# Patient Record
Sex: Female | Born: 1945 | Race: White | Hispanic: No | Marital: Married | Smoking: Former smoker
Health system: Southern US, Community
[De-identification: ages and names within clinical notes are randomized; demographics above are authoritative.]

## PROBLEM LIST (undated history)

## (undated) DIAGNOSIS — E559 Vitamin D deficiency, unspecified: Secondary | ICD-10-CM

## (undated) DIAGNOSIS — E2839 Other primary ovarian failure: Secondary | ICD-10-CM

## (undated) DIAGNOSIS — I8393 Asymptomatic varicose veins of bilateral lower extremities: Secondary | ICD-10-CM

## (undated) DIAGNOSIS — J309 Allergic rhinitis, unspecified: Secondary | ICD-10-CM

## (undated) DIAGNOSIS — M67432 Ganglion, left wrist: Secondary | ICD-10-CM

## (undated) DIAGNOSIS — E669 Obesity, unspecified: Secondary | ICD-10-CM

## (undated) DIAGNOSIS — N390 Urinary tract infection, site not specified: Secondary | ICD-10-CM

## (undated) DIAGNOSIS — R42 Dizziness and giddiness: Secondary | ICD-10-CM

## (undated) DIAGNOSIS — M7989 Other specified soft tissue disorders: Secondary | ICD-10-CM

## (undated) DIAGNOSIS — K5909 Other constipation: Secondary | ICD-10-CM

## (undated) DIAGNOSIS — E781 Pure hyperglyceridemia: Secondary | ICD-10-CM

## (undated) DIAGNOSIS — Z780 Asymptomatic menopausal state: Secondary | ICD-10-CM

## (undated) DIAGNOSIS — E782 Mixed hyperlipidemia: Secondary | ICD-10-CM

## (undated) DIAGNOSIS — K219 Gastro-esophageal reflux disease without esophagitis: Secondary | ICD-10-CM

## (undated) DIAGNOSIS — R600 Localized edema: Secondary | ICD-10-CM

## (undated) DIAGNOSIS — E785 Hyperlipidemia, unspecified: Secondary | ICD-10-CM

## (undated) DIAGNOSIS — I10 Essential (primary) hypertension: Secondary | ICD-10-CM

## (undated) DIAGNOSIS — K279 Peptic ulcer, site unspecified, unspecified as acute or chronic, without hemorrhage or perforation: Secondary | ICD-10-CM

## (undated) DIAGNOSIS — M1712 Unilateral primary osteoarthritis, left knee: Secondary | ICD-10-CM

## (undated) DIAGNOSIS — H68009 Unspecified Eustachian salpingitis, unspecified ear: Secondary | ICD-10-CM

## (undated) DIAGNOSIS — R200 Anesthesia of skin: Secondary | ICD-10-CM

## (undated) DIAGNOSIS — G4700 Insomnia, unspecified: Secondary | ICD-10-CM

## (undated) DIAGNOSIS — R739 Hyperglycemia, unspecified: Secondary | ICD-10-CM

## (undated) DIAGNOSIS — N3281 Overactive bladder: Secondary | ICD-10-CM

## (undated) DIAGNOSIS — R7303 Prediabetes: Secondary | ICD-10-CM

## (undated) DIAGNOSIS — L03039 Cellulitis of unspecified toe: Secondary | ICD-10-CM

## (undated) DIAGNOSIS — R1310 Dysphagia, unspecified: Secondary | ICD-10-CM

## (undated) DIAGNOSIS — I83893 Varicose veins of bilateral lower extremities with other complications: Secondary | ICD-10-CM

## (undated) DIAGNOSIS — M79671 Pain in right foot: Secondary | ICD-10-CM

## (undated) DIAGNOSIS — M810 Age-related osteoporosis without current pathological fracture: Secondary | ICD-10-CM

## (undated) DIAGNOSIS — J209 Acute bronchitis, unspecified: Secondary | ICD-10-CM

## (undated) DIAGNOSIS — N760 Acute vaginitis: Secondary | ICD-10-CM

## (undated) HISTORY — DX: Prediabetes: R73.03

## (undated) HISTORY — DX: Mixed hyperlipidemia: E78.2

## (undated) HISTORY — DX: Peptic ulcer, site unspecified, unspecified as acute or chronic, without hemorrhage or perforation: K27.9

## (undated) HISTORY — DX: Acute bronchitis, unspecified: J20.9

## (undated) HISTORY — DX: Hyperglycemia, unspecified: R73.9

## (undated) HISTORY — DX: Overactive bladder: N32.81

## (undated) HISTORY — DX: Pure hyperglyceridemia: E78.1

## (undated) HISTORY — DX: Urinary tract infection, site not specified: N39.0

## (undated) HISTORY — DX: Essential (primary) hypertension: I10

## (undated) HISTORY — DX: Other primary ovarian failure: E28.39

## (undated) HISTORY — DX: Unilateral primary osteoarthritis, left knee: M17.12

## (undated) HISTORY — DX: Allergic rhinitis, unspecified: J30.9

## (undated) HISTORY — DX: Varicose veins of bilateral lower extremities with other complications: I83.893

## (undated) HISTORY — DX: Age-related osteoporosis without current pathological fracture: M81.0

## (undated) HISTORY — DX: Vitamin D deficiency, unspecified: E55.9

## (undated) HISTORY — DX: Gastro-esophageal reflux disease without esophagitis: K21.9

## (undated) HISTORY — DX: Anesthesia of skin: R20.0

## (undated) HISTORY — DX: Insomnia, unspecified: G47.00

## (undated) HISTORY — DX: Other constipation: K59.09

## (undated) HISTORY — DX: Other specified soft tissue disorders: M79.89

## (undated) HISTORY — DX: Asymptomatic menopausal state: Z78.0

## (undated) HISTORY — DX: Unspecified eustachian salpingitis, unspecified ear: H68.009

## (undated) HISTORY — DX: Cellulitis of unspecified toe: L03.039

## (undated) HISTORY — DX: Asymptomatic varicose veins of bilateral lower extremities: I83.93

## (undated) HISTORY — DX: Hyperlipidemia, unspecified: E78.5

## (undated) HISTORY — DX: Obesity, unspecified: E66.9

## (undated) HISTORY — DX: Ganglion, left wrist: M67.432

## (undated) HISTORY — DX: Pain in right foot: M79.671

## (undated) HISTORY — DX: Localized edema: R60.0

## (undated) HISTORY — DX: Acute vaginitis: N76.0

## (undated) HISTORY — DX: Dysphagia, unspecified: R13.10

## (undated) HISTORY — DX: Dizziness and giddiness: R42

---

## 1976-02-19 HISTORY — PX: PARTIAL HYSTERECTOMY: SHX80

## 2011-03-12 DIAGNOSIS — Z79899 Other long term (current) drug therapy: Secondary | ICD-10-CM | POA: Diagnosis not present

## 2011-03-12 DIAGNOSIS — N390 Urinary tract infection, site not specified: Secondary | ICD-10-CM | POA: Diagnosis not present

## 2011-03-12 DIAGNOSIS — E782 Mixed hyperlipidemia: Secondary | ICD-10-CM | POA: Diagnosis not present

## 2011-03-12 DIAGNOSIS — R609 Edema, unspecified: Secondary | ICD-10-CM | POA: Diagnosis not present

## 2011-03-12 DIAGNOSIS — I10 Essential (primary) hypertension: Secondary | ICD-10-CM | POA: Diagnosis not present

## 2011-03-26 DIAGNOSIS — Z6830 Body mass index (BMI) 30.0-30.9, adult: Secondary | ICD-10-CM | POA: Diagnosis not present

## 2011-03-26 DIAGNOSIS — M1710 Unilateral primary osteoarthritis, unspecified knee: Secondary | ICD-10-CM | POA: Diagnosis not present

## 2011-03-26 DIAGNOSIS — I10 Essential (primary) hypertension: Secondary | ICD-10-CM | POA: Diagnosis not present

## 2011-03-26 DIAGNOSIS — IMO0002 Reserved for concepts with insufficient information to code with codable children: Secondary | ICD-10-CM | POA: Diagnosis not present

## 2011-03-26 DIAGNOSIS — N760 Acute vaginitis: Secondary | ICD-10-CM | POA: Diagnosis not present

## 2011-04-23 DIAGNOSIS — H68009 Unspecified Eustachian salpingitis, unspecified ear: Secondary | ICD-10-CM | POA: Diagnosis not present

## 2011-04-23 DIAGNOSIS — I10 Essential (primary) hypertension: Secondary | ICD-10-CM | POA: Diagnosis not present

## 2011-04-23 DIAGNOSIS — J45909 Unspecified asthma, uncomplicated: Secondary | ICD-10-CM | POA: Diagnosis not present

## 2011-04-23 DIAGNOSIS — J309 Allergic rhinitis, unspecified: Secondary | ICD-10-CM | POA: Diagnosis not present

## 2011-05-29 DIAGNOSIS — R609 Edema, unspecified: Secondary | ICD-10-CM | POA: Diagnosis not present

## 2011-05-29 DIAGNOSIS — H68009 Unspecified Eustachian salpingitis, unspecified ear: Secondary | ICD-10-CM | POA: Diagnosis not present

## 2011-05-29 DIAGNOSIS — J309 Allergic rhinitis, unspecified: Secondary | ICD-10-CM | POA: Diagnosis not present

## 2011-05-29 DIAGNOSIS — I10 Essential (primary) hypertension: Secondary | ICD-10-CM | POA: Diagnosis not present

## 2011-07-01 DIAGNOSIS — I10 Essential (primary) hypertension: Secondary | ICD-10-CM | POA: Diagnosis not present

## 2011-07-01 DIAGNOSIS — J309 Allergic rhinitis, unspecified: Secondary | ICD-10-CM | POA: Diagnosis not present

## 2011-07-01 DIAGNOSIS — R609 Edema, unspecified: Secondary | ICD-10-CM | POA: Diagnosis not present

## 2011-07-01 DIAGNOSIS — Z6830 Body mass index (BMI) 30.0-30.9, adult: Secondary | ICD-10-CM | POA: Diagnosis not present

## 2011-07-01 DIAGNOSIS — Z79899 Other long term (current) drug therapy: Secondary | ICD-10-CM | POA: Diagnosis not present

## 2011-10-01 DIAGNOSIS — J309 Allergic rhinitis, unspecified: Secondary | ICD-10-CM | POA: Diagnosis not present

## 2011-10-01 DIAGNOSIS — Z79899 Other long term (current) drug therapy: Secondary | ICD-10-CM | POA: Diagnosis not present

## 2011-10-01 DIAGNOSIS — E782 Mixed hyperlipidemia: Secondary | ICD-10-CM | POA: Diagnosis not present

## 2011-10-01 DIAGNOSIS — E559 Vitamin D deficiency, unspecified: Secondary | ICD-10-CM | POA: Diagnosis not present

## 2011-10-01 DIAGNOSIS — R609 Edema, unspecified: Secondary | ICD-10-CM | POA: Diagnosis not present

## 2011-12-31 DIAGNOSIS — E785 Hyperlipidemia, unspecified: Secondary | ICD-10-CM | POA: Diagnosis not present

## 2011-12-31 DIAGNOSIS — M1710 Unilateral primary osteoarthritis, unspecified knee: Secondary | ICD-10-CM | POA: Diagnosis not present

## 2011-12-31 DIAGNOSIS — Z1212 Encounter for screening for malignant neoplasm of rectum: Secondary | ICD-10-CM | POA: Diagnosis not present

## 2011-12-31 DIAGNOSIS — I10 Essential (primary) hypertension: Secondary | ICD-10-CM | POA: Diagnosis not present

## 2011-12-31 DIAGNOSIS — Z0000 Encounter for general adult medical examination without abnormal findings: Secondary | ICD-10-CM | POA: Diagnosis not present

## 2011-12-31 DIAGNOSIS — Z6830 Body mass index (BMI) 30.0-30.9, adult: Secondary | ICD-10-CM | POA: Diagnosis not present

## 2011-12-31 DIAGNOSIS — Z79899 Other long term (current) drug therapy: Secondary | ICD-10-CM | POA: Diagnosis not present

## 2011-12-31 DIAGNOSIS — Z23 Encounter for immunization: Secondary | ICD-10-CM | POA: Diagnosis not present

## 2012-01-08 DIAGNOSIS — Z13820 Encounter for screening for osteoporosis: Secondary | ICD-10-CM | POA: Diagnosis not present

## 2012-01-08 DIAGNOSIS — Z1231 Encounter for screening mammogram for malignant neoplasm of breast: Secondary | ICD-10-CM | POA: Diagnosis not present

## 2012-04-07 DIAGNOSIS — M1710 Unilateral primary osteoarthritis, unspecified knee: Secondary | ICD-10-CM | POA: Diagnosis not present

## 2012-04-07 DIAGNOSIS — I10 Essential (primary) hypertension: Secondary | ICD-10-CM | POA: Diagnosis not present

## 2012-04-07 DIAGNOSIS — E782 Mixed hyperlipidemia: Secondary | ICD-10-CM | POA: Diagnosis not present

## 2012-04-07 DIAGNOSIS — Z6831 Body mass index (BMI) 31.0-31.9, adult: Secondary | ICD-10-CM | POA: Diagnosis not present

## 2012-05-27 DIAGNOSIS — H2510 Age-related nuclear cataract, unspecified eye: Secondary | ICD-10-CM | POA: Diagnosis not present

## 2012-07-15 DIAGNOSIS — I10 Essential (primary) hypertension: Secondary | ICD-10-CM | POA: Diagnosis not present

## 2012-07-15 DIAGNOSIS — E782 Mixed hyperlipidemia: Secondary | ICD-10-CM | POA: Diagnosis not present

## 2012-07-15 DIAGNOSIS — E559 Vitamin D deficiency, unspecified: Secondary | ICD-10-CM | POA: Diagnosis not present

## 2012-07-15 DIAGNOSIS — M1710 Unilateral primary osteoarthritis, unspecified knee: Secondary | ICD-10-CM | POA: Diagnosis not present

## 2012-07-15 DIAGNOSIS — Z6830 Body mass index (BMI) 30.0-30.9, adult: Secondary | ICD-10-CM | POA: Diagnosis not present

## 2012-07-15 DIAGNOSIS — Z79899 Other long term (current) drug therapy: Secondary | ICD-10-CM | POA: Diagnosis not present

## 2012-07-16 DIAGNOSIS — M25569 Pain in unspecified knee: Secondary | ICD-10-CM | POA: Diagnosis not present

## 2012-07-16 DIAGNOSIS — IMO0002 Reserved for concepts with insufficient information to code with codable children: Secondary | ICD-10-CM | POA: Diagnosis not present

## 2012-07-16 DIAGNOSIS — M1710 Unilateral primary osteoarthritis, unspecified knee: Secondary | ICD-10-CM | POA: Diagnosis not present

## 2012-07-16 DIAGNOSIS — M7989 Other specified soft tissue disorders: Secondary | ICD-10-CM | POA: Diagnosis not present

## 2012-10-15 DIAGNOSIS — N390 Urinary tract infection, site not specified: Secondary | ICD-10-CM | POA: Diagnosis not present

## 2012-10-15 DIAGNOSIS — R300 Dysuria: Secondary | ICD-10-CM | POA: Diagnosis not present

## 2012-10-15 DIAGNOSIS — I10 Essential (primary) hypertension: Secondary | ICD-10-CM | POA: Diagnosis not present

## 2012-11-02 DIAGNOSIS — I10 Essential (primary) hypertension: Secondary | ICD-10-CM | POA: Diagnosis not present

## 2012-11-02 DIAGNOSIS — E782 Mixed hyperlipidemia: Secondary | ICD-10-CM | POA: Diagnosis not present

## 2012-11-02 DIAGNOSIS — M1990 Unspecified osteoarthritis, unspecified site: Secondary | ICD-10-CM | POA: Diagnosis not present

## 2012-11-02 DIAGNOSIS — E783 Hyperchylomicronemia: Secondary | ICD-10-CM | POA: Diagnosis not present

## 2012-11-02 DIAGNOSIS — E781 Pure hyperglyceridemia: Secondary | ICD-10-CM | POA: Diagnosis not present

## 2012-11-02 DIAGNOSIS — R609 Edema, unspecified: Secondary | ICD-10-CM | POA: Diagnosis not present

## 2012-11-02 DIAGNOSIS — R7309 Other abnormal glucose: Secondary | ICD-10-CM | POA: Diagnosis not present

## 2012-12-02 DIAGNOSIS — I10 Essential (primary) hypertension: Secondary | ICD-10-CM | POA: Diagnosis not present

## 2012-12-02 DIAGNOSIS — E119 Type 2 diabetes mellitus without complications: Secondary | ICD-10-CM | POA: Diagnosis not present

## 2012-12-02 DIAGNOSIS — E785 Hyperlipidemia, unspecified: Secondary | ICD-10-CM | POA: Diagnosis not present

## 2012-12-02 DIAGNOSIS — Z23 Encounter for immunization: Secondary | ICD-10-CM | POA: Diagnosis not present

## 2013-03-09 DIAGNOSIS — I10 Essential (primary) hypertension: Secondary | ICD-10-CM | POA: Diagnosis not present

## 2013-03-09 DIAGNOSIS — Z0000 Encounter for general adult medical examination without abnormal findings: Secondary | ICD-10-CM | POA: Diagnosis not present

## 2013-03-09 DIAGNOSIS — Z1212 Encounter for screening for malignant neoplasm of rectum: Secondary | ICD-10-CM | POA: Diagnosis not present

## 2013-03-09 DIAGNOSIS — E119 Type 2 diabetes mellitus without complications: Secondary | ICD-10-CM | POA: Diagnosis not present

## 2013-03-23 DIAGNOSIS — R42 Dizziness and giddiness: Secondary | ICD-10-CM | POA: Diagnosis not present

## 2013-03-23 DIAGNOSIS — J309 Allergic rhinitis, unspecified: Secondary | ICD-10-CM | POA: Diagnosis not present

## 2013-03-23 DIAGNOSIS — I10 Essential (primary) hypertension: Secondary | ICD-10-CM | POA: Diagnosis not present

## 2013-04-21 DIAGNOSIS — Z79899 Other long term (current) drug therapy: Secondary | ICD-10-CM | POA: Diagnosis not present

## 2013-04-21 DIAGNOSIS — E119 Type 2 diabetes mellitus without complications: Secondary | ICD-10-CM | POA: Diagnosis not present

## 2013-04-21 DIAGNOSIS — J309 Allergic rhinitis, unspecified: Secondary | ICD-10-CM | POA: Diagnosis not present

## 2013-04-21 DIAGNOSIS — H68009 Unspecified Eustachian salpingitis, unspecified ear: Secondary | ICD-10-CM | POA: Diagnosis not present

## 2013-04-21 DIAGNOSIS — E785 Hyperlipidemia, unspecified: Secondary | ICD-10-CM | POA: Diagnosis not present

## 2013-04-21 DIAGNOSIS — E782 Mixed hyperlipidemia: Secondary | ICD-10-CM | POA: Diagnosis not present

## 2013-04-21 DIAGNOSIS — I10 Essential (primary) hypertension: Secondary | ICD-10-CM | POA: Diagnosis not present

## 2013-06-15 DIAGNOSIS — I10 Essential (primary) hypertension: Secondary | ICD-10-CM | POA: Diagnosis not present

## 2013-06-15 DIAGNOSIS — M1710 Unilateral primary osteoarthritis, unspecified knee: Secondary | ICD-10-CM | POA: Diagnosis not present

## 2013-06-15 DIAGNOSIS — IMO0002 Reserved for concepts with insufficient information to code with codable children: Secondary | ICD-10-CM | POA: Diagnosis not present

## 2013-08-11 DIAGNOSIS — K279 Peptic ulcer, site unspecified, unspecified as acute or chronic, without hemorrhage or perforation: Secondary | ICD-10-CM | POA: Diagnosis not present

## 2013-08-11 DIAGNOSIS — E119 Type 2 diabetes mellitus without complications: Secondary | ICD-10-CM | POA: Diagnosis not present

## 2013-08-11 DIAGNOSIS — I10 Essential (primary) hypertension: Secondary | ICD-10-CM | POA: Diagnosis not present

## 2013-08-11 DIAGNOSIS — E782 Mixed hyperlipidemia: Secondary | ICD-10-CM | POA: Diagnosis not present

## 2013-08-11 DIAGNOSIS — J309 Allergic rhinitis, unspecified: Secondary | ICD-10-CM | POA: Diagnosis not present

## 2013-11-03 DIAGNOSIS — I10 Essential (primary) hypertension: Secondary | ICD-10-CM | POA: Diagnosis not present

## 2013-11-03 DIAGNOSIS — M159 Polyosteoarthritis, unspecified: Secondary | ICD-10-CM | POA: Diagnosis not present

## 2013-11-03 DIAGNOSIS — E119 Type 2 diabetes mellitus without complications: Secondary | ICD-10-CM | POA: Diagnosis not present

## 2013-11-03 DIAGNOSIS — M25569 Pain in unspecified knee: Secondary | ICD-10-CM | POA: Diagnosis not present

## 2013-11-22 DIAGNOSIS — E119 Type 2 diabetes mellitus without complications: Secondary | ICD-10-CM | POA: Diagnosis not present

## 2013-11-22 DIAGNOSIS — K219 Gastro-esophageal reflux disease without esophagitis: Secondary | ICD-10-CM | POA: Diagnosis not present

## 2013-11-22 DIAGNOSIS — I10 Essential (primary) hypertension: Secondary | ICD-10-CM | POA: Diagnosis not present

## 2013-11-22 DIAGNOSIS — Z1389 Encounter for screening for other disorder: Secondary | ICD-10-CM | POA: Diagnosis not present

## 2013-11-22 DIAGNOSIS — Z9181 History of falling: Secondary | ICD-10-CM | POA: Diagnosis not present

## 2013-11-22 DIAGNOSIS — E782 Mixed hyperlipidemia: Secondary | ICD-10-CM | POA: Diagnosis not present

## 2014-03-02 DIAGNOSIS — Z6831 Body mass index (BMI) 31.0-31.9, adult: Secondary | ICD-10-CM | POA: Diagnosis not present

## 2014-03-02 DIAGNOSIS — E559 Vitamin D deficiency, unspecified: Secondary | ICD-10-CM | POA: Diagnosis not present

## 2014-03-02 DIAGNOSIS — Z1231 Encounter for screening mammogram for malignant neoplasm of breast: Secondary | ICD-10-CM | POA: Diagnosis not present

## 2014-03-02 DIAGNOSIS — I10 Essential (primary) hypertension: Secondary | ICD-10-CM | POA: Diagnosis not present

## 2014-03-02 DIAGNOSIS — Z79899 Other long term (current) drug therapy: Secondary | ICD-10-CM | POA: Diagnosis not present

## 2014-03-02 DIAGNOSIS — M179 Osteoarthritis of knee, unspecified: Secondary | ICD-10-CM | POA: Diagnosis not present

## 2014-06-11 HISTORY — PX: REPLACEMENT TOTAL KNEE: SUR1224

## 2015-02-19 HISTORY — PX: CATARACT EXTRACTION: SUR2

## 2018-01-20 ENCOUNTER — Inpatient Hospital Stay (HOSPITAL_COMMUNITY)
Admission: AD | Admit: 2018-01-20 | Discharge: 2018-01-21 | DRG: 185 | Disposition: A | Discharge status: Home / Self Care | Payer: Medicare Other | Source: Other Acute Inpatient Hospital | Attending: General Surgery | Admitting: General Surgery

## 2018-01-20 DIAGNOSIS — I10 Essential (primary) hypertension: Secondary | ICD-10-CM | POA: Diagnosis not present

## 2018-01-20 DIAGNOSIS — S2239XA Fracture of one rib, unspecified side, initial encounter for closed fracture: Secondary | ICD-10-CM

## 2018-01-20 DIAGNOSIS — E785 Hyperlipidemia, unspecified: Secondary | ICD-10-CM | POA: Diagnosis not present

## 2018-01-20 DIAGNOSIS — M79644 Pain in right finger(s): Secondary | ICD-10-CM | POA: Diagnosis present

## 2018-01-20 DIAGNOSIS — S62303A Unspecified fracture of third metacarpal bone, left hand, initial encounter for closed fracture: Secondary | ICD-10-CM | POA: Diagnosis present

## 2018-01-20 DIAGNOSIS — S2242XA Multiple fractures of ribs, left side, initial encounter for closed fracture: Principal | ICD-10-CM | POA: Diagnosis present

## 2018-01-20 DIAGNOSIS — M25539 Pain in unspecified wrist: Secondary | ICD-10-CM

## 2018-01-20 MED ORDER — ACETAMINOPHEN 325 MG PO TABS
650.0000 mg | ORAL_TABLET | ORAL | Status: DC | PRN
  Administered 2018-01-21: 650 mg via ORAL
  Filled 2018-01-20: qty 2

## 2018-01-20 MED ORDER — MORPHINE SULFATE (PF) 2 MG/ML IV SOLN
2.0000 mg | INTRAVENOUS | Status: DC | PRN
  Administered 2018-01-21: 2 mg via INTRAVENOUS
  Filled 2018-01-20: qty 1

## 2018-01-20 MED ORDER — ENOXAPARIN SODIUM 40 MG/0.4ML ~~LOC~~ SOLN: 40.0000 mg | SUBCUTANEOUS | Status: DC

## 2018-01-20 MED ORDER — ONDANSETRON 4 MG PO TBDP: 4.0000 mg | ORAL_TABLET | Freq: Four times a day (QID) | ORAL | Status: DC | PRN

## 2018-01-20 MED ORDER — DOCUSATE SODIUM 100 MG PO CAPS
100.0000 mg | ORAL_CAPSULE | Freq: Two times a day (BID) | ORAL | Status: DC
  Administered 2018-01-21: 100 mg via ORAL
  Filled 2018-01-20 (×2): qty 1

## 2018-01-20 MED ORDER — SODIUM CHLORIDE 0.9 % IV SOLN
INTRAVENOUS | Status: DC
  Administered 2018-01-21: 01:00:00 via INTRAVENOUS

## 2018-01-20 MED ORDER — OXYCODONE HCL 5 MG PO TABS
5.0000 mg | ORAL_TABLET | ORAL | Status: DC | PRN
  Administered 2018-01-21 (×2): 5 mg via ORAL
  Filled 2018-01-20 (×2): qty 1

## 2018-01-20 MED ORDER — HYDRALAZINE HCL 20 MG/ML IJ SOLN: 10.0000 mg | INTRAMUSCULAR | Status: DC | PRN

## 2018-01-20 MED ORDER — BISACODYL 10 MG RE SUPP: 10.0000 mg | Freq: Every day | RECTAL | Status: DC | PRN

## 2018-01-20 MED ORDER — ONDANSETRON HCL 4 MG/2ML IJ SOLN: 4.0000 mg | Freq: Four times a day (QID) | INTRAMUSCULAR | Status: DC | PRN
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
## 2018-01-20 NOTE — Progress Notes (Signed)
Pt arrived to floor transfer from [facility]. Pt alert and oriented x4. Oriented to room and call bell. Notified admitting MD that pt is here.

## 2018-01-21 ENCOUNTER — Inpatient Hospital Stay (HOSPITAL_COMMUNITY): Payer: Medicare Other

## 2018-01-21 DIAGNOSIS — I10 Essential (primary) hypertension: Secondary | ICD-10-CM | POA: Diagnosis not present

## 2018-01-21 DIAGNOSIS — M79644 Pain in right finger(s): Secondary | ICD-10-CM | POA: Diagnosis not present

## 2018-01-21 DIAGNOSIS — S2242XA Multiple fractures of ribs, left side, initial encounter for closed fracture: Secondary | ICD-10-CM | POA: Diagnosis present

## 2018-01-21 DIAGNOSIS — S62303A Unspecified fracture of third metacarpal bone, left hand, initial encounter for closed fracture: Secondary | ICD-10-CM | POA: Diagnosis not present

## 2018-01-21 DIAGNOSIS — E785 Hyperlipidemia, unspecified: Secondary | ICD-10-CM | POA: Diagnosis not present

## 2018-01-21 LAB — CBC
HCT: 36.6 % (ref 36.0–46.0)
Hemoglobin: 11.6 g/dL — ABNORMAL LOW (ref 12.0–15.0)
MCH: 29.9 pg (ref 26.0–34.0)
MCHC: 31.7 g/dL (ref 30.0–36.0)
MCV: 94.3 fL (ref 80.0–100.0)
Platelets: 198 10*3/uL (ref 150–400)
RBC: 3.88 MIL/uL (ref 3.87–5.11)
RDW: 13.4 % (ref 11.5–15.5)
WBC: 7.5 10*3/uL (ref 4.0–10.5)
nRBC: 0 % (ref 0.0–0.2)

## 2018-01-21 LAB — BASIC METABOLIC PANEL
Anion gap: 12 (ref 5–15)
BUN: 21 mg/dL (ref 8–23)
CO2: 25 mmol/L (ref 22–32)
Calcium: 8.9 mg/dL (ref 8.9–10.3)
Chloride: 102 mmol/L (ref 98–111)
Creatinine, Ser: 0.94 mg/dL (ref 0.44–1.00)
GFR calc Af Amer: >60 mL/min (ref >60)
GFR, EST NON AFRICAN AMERICAN: 60 mL/min — AB (ref >60)
Glucose, Bld: 86 mg/dL (ref 70–99)
Potassium: 3.6 mmol/L (ref 3.5–5.1)
SODIUM: 139 mmol/L (ref 135–145)

## 2018-01-21 MED ORDER — OXYCODONE HCL 5 MG PO TABS: 5.0000 mg | ORAL_TABLET | Freq: Four times a day (QID) | ORAL | 0 refills | Status: DC | PRN

## 2018-01-21 MED ORDER — METHOCARBAMOL 500 MG PO TABS
500.0000 mg | ORAL_TABLET | Freq: Three times a day (TID) | ORAL | Status: DC | PRN
  Administered 2018-01-21: 500 mg via ORAL
  Filled 2018-01-21: qty 1

## 2018-01-21 MED ORDER — DOCUSATE SODIUM 100 MG PO CAPS: 100.0000 mg | ORAL_CAPSULE | Freq: Two times a day (BID) | ORAL | 0 refills | Status: DC
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
## 2018-01-21 NOTE — Discharge Instructions (Addendum)
Rib Fracture A rib fracture is a break or crack in one of the bones of the ribs. The ribs are like a cage that goes around your upper chest. A broken or cracked rib is often painful, but most do not cause other problems. Most rib fractures heal on their own in 1-3 months. Follow these instructions at home:  Avoid activities that cause pain to the injured area. Protect your injured area.  Slowly increase activity as told by your doctor.  Take medicine as told by your doctor.  Put ice on the injured area for the first 1-2 days after you have been treated or as told by your doctor. ? Put ice in a plastic bag. ? Place a towel between your skin and the bag. ? Leave the ice on for 15-20 minutes at a time, every 2 hours while you are awake.  Do deep breathing as told by your doctor. You may be told to: ? Take deep breaths many times a day. ? Cough many times a day while hugging a pillow. ? Use a device (incentive spirometer) to perform deep breathing many times a day.  Drink enough fluids to keep your pee (urine) clear or pale yellow.  Do not wear a rib belt or binder. These do not allow you to breathe deeply. Get help right away if:  You have a fever.  You have trouble breathing.  You cannot stop coughing.  You cough up thick or bloody spit (mucus).  You feel sick to your stomach (nauseous), throw up (vomit), or have belly (abdominal) pain.  Your pain gets worse and medicine does not help. This information is not intended to replace advice given to you by your health care provider. Make sure you discuss any questions you have with your health care provider. Document Released: 11/14/2007 Document Revised: 07/13/2015 Document Reviewed: 04/08/2012 Elsevier Interactive Patient Education  2018 [employer]vier Inc[employer]    Cast or Splint Care, Adult Casts and splints are supports that are worn to protect broken bones and other injuries. A cast or splint may hold a bone still and in the correct  position while it heals. Casts and splints may also help to ease pain, swelling, and muscle spasms. How to care for your cast  Do not stick anything inside the cast to scratch your skin.  Check the skin around the cast every day. Tell your doctor about any concerns.  You may put lotion on dry skin around the edges of the cast. Do not put lotion on the skin under the cast.  Keep the cast clean.  If the cast is not waterproof: ? Do not let it get wet. ? Cover it with a watertight covering when you take a bath or a shower. How to care for your splint  Wear it as told by your doctor. Take it off only as told by your doctor.  Loosen the splint if your fingers or toes tingle, get numb, or turn cold and blue.  Keep the splint clean.  If the splint is not waterproof: ? Do not let it get wet. ? Cover it with a watertight covering when you take a bath or a shower. Follow these instructions at home: Bathing  Do not take baths or swim until your doctor says it is okay. Ask your doctor if you can take showers. You may only be allowed to take sponge baths for bathing.  If your cast or splint is not waterproof, cover it with a watertight covering when  you take a bath or shower. Managing pain, stiffness, and swelling  Move your fingers or toes often to avoid stiffness and to lessen swelling.  Raise (elevate) the injured area above the level of your heart while sitting or lying down. Safety  Do not use the injured limb to support your body weight until your doctor says that it is okay.  Use crutches or other assistive devices as told by your doctor. General instructions  Do not put pressure on any part of the cast or splint until it is fully hardened. This may take many hours.  Return to your normal activities as told by your doctor. Ask your doctor what activities are safe for you.  Keep all follow-up visits as told by your doctor. This is important. Contact a doctor if:  Your cast  or splint gets damaged.  The skin around the cast gets red or raw.  The skin under the cast is very itchy or painful.  Your cast or splint feels very uncomfortable.  Your cast or splint is too tight or too loose.  Your cast becomes wet or it starts to have a soft spot or area.  You get an object stuck under your cast. Get help right away if:  Your pain gets worse.  The injured area tingles, gets numb, or turns blue and cold.  The part of your body above or below the cast is swollen and it turns a different color (is discolored).  You cannot feel or move your fingers or toes.  There is fluid leaking through the cast.  You have very bad pain or pressure under the cast.  You have trouble breathing.  You have shortness of breath.  You have chest pain. This information is not intended to replace advice given to you by your health care provider. Make sure you discuss any questions you have with your health care provider. Document Released: 06/06/2010 Document Revised: 01/26/2016 Document Reviewed: 01/26/2016 Elsevier Interactive Patient Education  2017 [employer]
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
## 2018-01-21 NOTE — Progress Notes (Signed)
CSW met with patient, with husband at bedside. Patient provided permission for CSW to speak with her while husband present. CSW completed SBIRT with patient. Patient reported no other concerns at this time.  CSW signing off.  Laveda Abbe, [place] Clinical Social Worker [phone]
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
## 2018-01-21 NOTE — Progress Notes (Signed)
Orthopedic Tech Progress Note Patient Details:  Virginia BertholdBarbara Pitts 04-25-1945 [id number]  Ortho Devices Type of Ortho Device: Thumb spica splint Splint Material: Fiberglass Ortho Device/Splint Location: rue Ortho Device/Splint Interventions: Ordered, Application, Adjustment   Post Interventions Patient Tolerated: Well Instructions Provided: Care of device, Adjustment of device   Trinna PostMartinez, Alecia Doi J 01/21/2018, 2:49 PM

## 2018-01-26 DIAGNOSIS — M79642 Pain in left hand: Secondary | ICD-10-CM

## 2018-01-26 DIAGNOSIS — S62329A Displaced fracture of shaft of unspecified metacarpal bone, initial encounter for closed fracture: Secondary | ICD-10-CM

## 2018-01-26 HISTORY — DX: Displaced fracture of shaft of unspecified metacarpal bone, initial encounter for closed fracture: S62.329A

## 2018-01-26 HISTORY — DX: Pain in left hand: M79.642
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
## 2019-08-07 LAB — EXTERNAL GENERIC LAB PROCEDURE: COLOGUARD: NEGATIVE

## 2019-12-10 ENCOUNTER — Ambulatory Visit: Payer: Self-pay

## 2019-12-10 ENCOUNTER — Other Ambulatory Visit: Payer: Self-pay

## 2019-12-10 DIAGNOSIS — Z20822 Contact with and (suspected) exposure to covid-19: Secondary | ICD-10-CM

## 2019-12-11 LAB — SARS-COV-2, NAA 2 DAY TAT

## 2019-12-11 LAB — NOVEL CORONAVIRUS, NAA: SARS-CoV-2, NAA: NOT DETECTED

## 2020-04-06 ENCOUNTER — Other Ambulatory Visit: Payer: Self-pay

## 2020-04-06 ENCOUNTER — Ambulatory Visit (INDEPENDENT_AMBULATORY_CARE_PROVIDER_SITE_OTHER): Payer: BC Managed Care – PPO

## 2020-04-06 ENCOUNTER — Encounter: Payer: Self-pay | Admitting: Family Medicine

## 2020-04-06 DIAGNOSIS — Z23 Encounter for immunization: Secondary | ICD-10-CM

## 2020-04-06 NOTE — Progress Notes (Signed)
   Covid-19 Vaccination Clinic  Name:  Zaakirah Kistner    MRN: [id number] DOB: 06-May-1945  04/06/2020  Ms. Digman was observed post Covid-19 immunization for 15 minutes without incident. She was provided with Vaccine Information Sheet and instruction to access the V-Safe system.   Ms. Tunnell was instructed to call 911 with any severe reactions post vaccine: Marland Kitchen Difficulty breathing  . Swelling of face and throat  . A fast heartbeat  . A bad rash all over body  . Dizziness and weakness   Immunizations Administered    Name Date Dose VIS Date Route   PFIZER Comrnaty(Gray TOP) Covid-19 Vaccine 04/06/2020 10:00 AM 0.3 mL 01/27/2020 Intramuscular   Manufacturer: [employer], [employer]   Lot: [id number]   NDC: [phone]

## 2020-05-11 DIAGNOSIS — K219 Gastro-esophageal reflux disease without esophagitis: Secondary | ICD-10-CM | POA: Diagnosis not present

## 2020-05-11 DIAGNOSIS — M1712 Unilateral primary osteoarthritis, left knee: Secondary | ICD-10-CM | POA: Diagnosis not present

## 2020-05-11 DIAGNOSIS — R600 Localized edema: Secondary | ICD-10-CM | POA: Diagnosis not present

## 2020-05-11 DIAGNOSIS — R7303 Prediabetes: Secondary | ICD-10-CM | POA: Diagnosis not present

## 2020-05-11 DIAGNOSIS — J309 Allergic rhinitis, unspecified: Secondary | ICD-10-CM | POA: Diagnosis not present

## 2020-05-11 DIAGNOSIS — I10 Essential (primary) hypertension: Secondary | ICD-10-CM | POA: Diagnosis not present

## 2020-05-11 DIAGNOSIS — E663 Overweight: Secondary | ICD-10-CM | POA: Diagnosis not present

## 2020-05-11 DIAGNOSIS — G4700 Insomnia, unspecified: Secondary | ICD-10-CM | POA: Diagnosis not present

## 2020-05-11 DIAGNOSIS — E782 Mixed hyperlipidemia: Secondary | ICD-10-CM | POA: Diagnosis not present

## 2020-05-11 DIAGNOSIS — Z79899 Other long term (current) drug therapy: Secondary | ICD-10-CM | POA: Diagnosis not present

## 2020-05-11 DIAGNOSIS — N3281 Overactive bladder: Secondary | ICD-10-CM | POA: Diagnosis not present

## 2020-05-11 DIAGNOSIS — E559 Vitamin D deficiency, unspecified: Secondary | ICD-10-CM | POA: Diagnosis not present

## 2020-08-31 DIAGNOSIS — E559 Vitamin D deficiency, unspecified: Secondary | ICD-10-CM | POA: Diagnosis not present

## 2020-08-31 DIAGNOSIS — N3281 Overactive bladder: Secondary | ICD-10-CM | POA: Diagnosis not present

## 2020-08-31 DIAGNOSIS — R600 Localized edema: Secondary | ICD-10-CM | POA: Diagnosis not present

## 2020-08-31 DIAGNOSIS — G4700 Insomnia, unspecified: Secondary | ICD-10-CM | POA: Diagnosis not present

## 2020-08-31 DIAGNOSIS — Z139 Encounter for screening, unspecified: Secondary | ICD-10-CM | POA: Diagnosis not present

## 2020-08-31 DIAGNOSIS — J309 Allergic rhinitis, unspecified: Secondary | ICD-10-CM | POA: Diagnosis not present

## 2020-08-31 DIAGNOSIS — Z6829 Body mass index (BMI) 29.0-29.9, adult: Secondary | ICD-10-CM | POA: Diagnosis not present

## 2020-08-31 DIAGNOSIS — M810 Age-related osteoporosis without current pathological fracture: Secondary | ICD-10-CM | POA: Diagnosis not present

## 2020-08-31 DIAGNOSIS — E782 Mixed hyperlipidemia: Secondary | ICD-10-CM | POA: Diagnosis not present

## 2020-08-31 DIAGNOSIS — R7303 Prediabetes: Secondary | ICD-10-CM | POA: Diagnosis not present

## 2020-08-31 DIAGNOSIS — I10 Essential (primary) hypertension: Secondary | ICD-10-CM | POA: Diagnosis not present

## 2020-08-31 DIAGNOSIS — K219 Gastro-esophageal reflux disease without esophagitis: Secondary | ICD-10-CM | POA: Diagnosis not present

## 2020-10-02 DIAGNOSIS — R200 Anesthesia of skin: Secondary | ICD-10-CM | POA: Diagnosis not present

## 2020-10-02 DIAGNOSIS — M7989 Other specified soft tissue disorders: Secondary | ICD-10-CM | POA: Diagnosis not present

## 2020-10-02 DIAGNOSIS — K219 Gastro-esophageal reflux disease without esophagitis: Secondary | ICD-10-CM | POA: Diagnosis not present

## 2020-10-02 DIAGNOSIS — I10 Essential (primary) hypertension: Secondary | ICD-10-CM | POA: Diagnosis not present

## 2020-11-01 DIAGNOSIS — M79672 Pain in left foot: Secondary | ICD-10-CM | POA: Diagnosis not present

## 2020-11-01 DIAGNOSIS — M79671 Pain in right foot: Secondary | ICD-10-CM | POA: Diagnosis not present

## 2020-11-01 DIAGNOSIS — R200 Anesthesia of skin: Secondary | ICD-10-CM | POA: Diagnosis not present

## 2020-11-01 DIAGNOSIS — J209 Acute bronchitis, unspecified: Secondary | ICD-10-CM | POA: Diagnosis not present

## 2020-11-01 DIAGNOSIS — J069 Acute upper respiratory infection, unspecified: Secondary | ICD-10-CM | POA: Diagnosis not present

## 2020-11-01 DIAGNOSIS — M7989 Other specified soft tissue disorders: Secondary | ICD-10-CM | POA: Diagnosis not present

## 2020-11-21 DIAGNOSIS — E785 Hyperlipidemia, unspecified: Secondary | ICD-10-CM | POA: Diagnosis not present

## 2020-11-21 DIAGNOSIS — Z1331 Encounter for screening for depression: Secondary | ICD-10-CM | POA: Diagnosis not present

## 2020-11-21 DIAGNOSIS — Z9181 History of falling: Secondary | ICD-10-CM | POA: Diagnosis not present

## 2020-11-21 DIAGNOSIS — Z0000 Encounter for general adult medical examination without abnormal findings: Secondary | ICD-10-CM | POA: Diagnosis not present

## 2020-12-28 DIAGNOSIS — Z139 Encounter for screening, unspecified: Secondary | ICD-10-CM | POA: Diagnosis not present

## 2020-12-28 DIAGNOSIS — E559 Vitamin D deficiency, unspecified: Secondary | ICD-10-CM | POA: Diagnosis not present

## 2020-12-28 DIAGNOSIS — K219 Gastro-esophageal reflux disease without esophagitis: Secondary | ICD-10-CM | POA: Diagnosis not present

## 2020-12-28 DIAGNOSIS — J309 Allergic rhinitis, unspecified: Secondary | ICD-10-CM | POA: Diagnosis not present

## 2020-12-28 DIAGNOSIS — M1712 Unilateral primary osteoarthritis, left knee: Secondary | ICD-10-CM | POA: Diagnosis not present

## 2020-12-28 DIAGNOSIS — Z23 Encounter for immunization: Secondary | ICD-10-CM | POA: Diagnosis not present

## 2020-12-28 DIAGNOSIS — M810 Age-related osteoporosis without current pathological fracture: Secondary | ICD-10-CM | POA: Diagnosis not present

## 2020-12-28 DIAGNOSIS — G4700 Insomnia, unspecified: Secondary | ICD-10-CM | POA: Diagnosis not present

## 2020-12-28 DIAGNOSIS — R7303 Prediabetes: Secondary | ICD-10-CM | POA: Diagnosis not present

## 2020-12-28 DIAGNOSIS — N3281 Overactive bladder: Secondary | ICD-10-CM | POA: Diagnosis not present

## 2020-12-28 DIAGNOSIS — Z79899 Other long term (current) drug therapy: Secondary | ICD-10-CM | POA: Diagnosis not present

## 2020-12-28 DIAGNOSIS — I10 Essential (primary) hypertension: Secondary | ICD-10-CM | POA: Diagnosis not present

## 2020-12-28 DIAGNOSIS — E782 Mixed hyperlipidemia: Secondary | ICD-10-CM | POA: Diagnosis not present

## 2021-04-17 DIAGNOSIS — M810 Age-related osteoporosis without current pathological fracture: Secondary | ICD-10-CM | POA: Diagnosis not present

## 2021-04-17 DIAGNOSIS — R600 Localized edema: Secondary | ICD-10-CM | POA: Diagnosis not present

## 2021-04-17 DIAGNOSIS — E559 Vitamin D deficiency, unspecified: Secondary | ICD-10-CM | POA: Diagnosis not present

## 2021-04-17 DIAGNOSIS — I10 Essential (primary) hypertension: Secondary | ICD-10-CM | POA: Diagnosis not present

## 2021-04-17 DIAGNOSIS — J309 Allergic rhinitis, unspecified: Secondary | ICD-10-CM | POA: Diagnosis not present

## 2021-04-17 DIAGNOSIS — K219 Gastro-esophageal reflux disease without esophagitis: Secondary | ICD-10-CM | POA: Diagnosis not present

## 2021-04-17 DIAGNOSIS — Z79899 Other long term (current) drug therapy: Secondary | ICD-10-CM | POA: Diagnosis not present

## 2021-04-17 DIAGNOSIS — M1712 Unilateral primary osteoarthritis, left knee: Secondary | ICD-10-CM | POA: Diagnosis not present

## 2021-04-17 DIAGNOSIS — E782 Mixed hyperlipidemia: Secondary | ICD-10-CM | POA: Diagnosis not present

## 2021-04-17 DIAGNOSIS — R7303 Prediabetes: Secondary | ICD-10-CM | POA: Diagnosis not present

## 2021-04-17 DIAGNOSIS — N3281 Overactive bladder: Secondary | ICD-10-CM | POA: Diagnosis not present

## 2021-04-17 DIAGNOSIS — G4700 Insomnia, unspecified: Secondary | ICD-10-CM | POA: Diagnosis not present

## 2021-05-15 DIAGNOSIS — M1712 Unilateral primary osteoarthritis, left knee: Secondary | ICD-10-CM | POA: Diagnosis not present

## 2021-05-17 DIAGNOSIS — Z6829 Body mass index (BMI) 29.0-29.9, adult: Secondary | ICD-10-CM | POA: Diagnosis not present

## 2021-05-17 DIAGNOSIS — E663 Overweight: Secondary | ICD-10-CM | POA: Diagnosis not present

## 2021-05-17 DIAGNOSIS — I10 Essential (primary) hypertension: Secondary | ICD-10-CM | POA: Diagnosis not present

## 2021-05-17 DIAGNOSIS — M1712 Unilateral primary osteoarthritis, left knee: Secondary | ICD-10-CM | POA: Diagnosis not present

## 2021-05-17 DIAGNOSIS — Z01818 Encounter for other preprocedural examination: Secondary | ICD-10-CM | POA: Diagnosis not present

## 2021-05-18 DIAGNOSIS — Z01818 Encounter for other preprocedural examination: Secondary | ICD-10-CM | POA: Diagnosis not present

## 2021-05-18 DIAGNOSIS — Z79899 Other long term (current) drug therapy: Secondary | ICD-10-CM | POA: Diagnosis not present

## 2021-05-25 ENCOUNTER — Encounter: Payer: Self-pay | Admitting: *Deleted

## 2021-05-25 ENCOUNTER — Encounter: Payer: Self-pay | Admitting: Cardiology

## 2021-06-05 ENCOUNTER — Encounter: Payer: Self-pay | Admitting: *Deleted

## 2021-06-05 ENCOUNTER — Encounter: Payer: Self-pay | Admitting: Cardiology

## 2021-06-05 DIAGNOSIS — J309 Allergic rhinitis, unspecified: Secondary | ICD-10-CM | POA: Insufficient documentation

## 2021-06-05 DIAGNOSIS — I10 Essential (primary) hypertension: Secondary | ICD-10-CM | POA: Insufficient documentation

## 2021-06-05 DIAGNOSIS — K219 Gastro-esophageal reflux disease without esophagitis: Secondary | ICD-10-CM | POA: Insufficient documentation

## 2021-06-05 DIAGNOSIS — K279 Peptic ulcer, site unspecified, unspecified as acute or chronic, without hemorrhage or perforation: Secondary | ICD-10-CM | POA: Insufficient documentation

## 2021-06-05 DIAGNOSIS — M1712 Unilateral primary osteoarthritis, left knee: Secondary | ICD-10-CM | POA: Insufficient documentation

## 2021-06-05 DIAGNOSIS — I83893 Varicose veins of bilateral lower extremities with other complications: Secondary | ICD-10-CM | POA: Insufficient documentation

## 2021-06-15 ENCOUNTER — Telehealth: Payer: Self-pay | Admitting: Cardiology

## 2021-06-15 NOTE — Telephone Encounter (Signed)
Spoke with Virginia Pitts at Dr. Lauralee Evener office about needing surgical clearance form for this pt before they are seen in our office. She let me know that pt is not having the surgery any more due to insurance and she was going to call and cancel appt with Dr. Geraldo Pitter. Called pt to confirm this but got voice mail. Left message for pt to call us back and cancel appt if need be. ?

## 2021-06-15 NOTE — Telephone Encounter (Signed)
Olegario Messier returning call from [place] regarding pt. Please advise ?

## 2021-06-21 ENCOUNTER — Other Ambulatory Visit: Payer: Self-pay

## 2021-06-21 DIAGNOSIS — N760 Acute vaginitis: Secondary | ICD-10-CM | POA: Insufficient documentation

## 2021-06-21 DIAGNOSIS — E669 Obesity, unspecified: Secondary | ICD-10-CM | POA: Insufficient documentation

## 2021-06-21 DIAGNOSIS — R1310 Dysphagia, unspecified: Secondary | ICD-10-CM | POA: Insufficient documentation

## 2021-06-21 DIAGNOSIS — N390 Urinary tract infection, site not specified: Secondary | ICD-10-CM | POA: Insufficient documentation

## 2021-06-21 DIAGNOSIS — L03039 Cellulitis of unspecified toe: Secondary | ICD-10-CM | POA: Insufficient documentation

## 2021-06-21 DIAGNOSIS — J209 Acute bronchitis, unspecified: Secondary | ICD-10-CM | POA: Insufficient documentation

## 2021-06-21 DIAGNOSIS — K5909 Other constipation: Secondary | ICD-10-CM | POA: Insufficient documentation

## 2021-06-21 DIAGNOSIS — J309 Allergic rhinitis, unspecified: Secondary | ICD-10-CM | POA: Insufficient documentation

## 2021-06-21 DIAGNOSIS — R739 Hyperglycemia, unspecified: Secondary | ICD-10-CM | POA: Insufficient documentation

## 2021-06-21 DIAGNOSIS — R7303 Prediabetes: Secondary | ICD-10-CM | POA: Insufficient documentation

## 2021-06-21 DIAGNOSIS — M810 Age-related osteoporosis without current pathological fracture: Secondary | ICD-10-CM | POA: Insufficient documentation

## 2021-06-21 DIAGNOSIS — E2839 Other primary ovarian failure: Secondary | ICD-10-CM | POA: Insufficient documentation

## 2021-06-21 DIAGNOSIS — E781 Pure hyperglyceridemia: Secondary | ICD-10-CM | POA: Insufficient documentation

## 2021-06-21 DIAGNOSIS — R200 Anesthesia of skin: Secondary | ICD-10-CM | POA: Insufficient documentation

## 2021-06-21 DIAGNOSIS — M7989 Other specified soft tissue disorders: Secondary | ICD-10-CM | POA: Insufficient documentation

## 2021-06-21 DIAGNOSIS — N3281 Overactive bladder: Secondary | ICD-10-CM | POA: Insufficient documentation

## 2021-06-21 DIAGNOSIS — G4700 Insomnia, unspecified: Secondary | ICD-10-CM | POA: Insufficient documentation

## 2021-06-21 DIAGNOSIS — M67432 Ganglion, left wrist: Secondary | ICD-10-CM | POA: Insufficient documentation

## 2021-06-21 DIAGNOSIS — E785 Hyperlipidemia, unspecified: Secondary | ICD-10-CM | POA: Insufficient documentation

## 2021-06-21 DIAGNOSIS — E559 Vitamin D deficiency, unspecified: Secondary | ICD-10-CM | POA: Insufficient documentation

## 2021-06-21 DIAGNOSIS — R600 Localized edema: Secondary | ICD-10-CM | POA: Insufficient documentation

## 2021-06-21 DIAGNOSIS — I8393 Asymptomatic varicose veins of bilateral lower extremities: Secondary | ICD-10-CM | POA: Insufficient documentation

## 2021-06-21 DIAGNOSIS — M79671 Pain in right foot: Secondary | ICD-10-CM | POA: Insufficient documentation

## 2021-06-21 DIAGNOSIS — E782 Mixed hyperlipidemia: Secondary | ICD-10-CM | POA: Insufficient documentation

## 2021-06-21 DIAGNOSIS — H68009 Unspecified Eustachian salpingitis, unspecified ear: Secondary | ICD-10-CM | POA: Insufficient documentation

## 2021-06-21 DIAGNOSIS — Z780 Asymptomatic menopausal state: Secondary | ICD-10-CM | POA: Insufficient documentation

## 2021-06-21 DIAGNOSIS — R42 Dizziness and giddiness: Secondary | ICD-10-CM | POA: Insufficient documentation

## 2021-06-26 ENCOUNTER — Ambulatory Visit: Payer: Medicare Other | Admitting: Cardiology

## 2021-07-19 DIAGNOSIS — M1712 Unilateral primary osteoarthritis, left knee: Secondary | ICD-10-CM | POA: Diagnosis not present

## 2021-07-19 DIAGNOSIS — K219 Gastro-esophageal reflux disease without esophagitis: Secondary | ICD-10-CM | POA: Diagnosis not present

## 2021-07-19 DIAGNOSIS — E782 Mixed hyperlipidemia: Secondary | ICD-10-CM | POA: Diagnosis not present

## 2021-07-19 DIAGNOSIS — Z6827 Body mass index (BMI) 27.0-27.9, adult: Secondary | ICD-10-CM | POA: Diagnosis not present

## 2021-07-19 DIAGNOSIS — G4700 Insomnia, unspecified: Secondary | ICD-10-CM | POA: Diagnosis not present

## 2021-07-19 DIAGNOSIS — I10 Essential (primary) hypertension: Secondary | ICD-10-CM | POA: Diagnosis not present

## 2021-07-19 DIAGNOSIS — R7303 Prediabetes: Secondary | ICD-10-CM | POA: Diagnosis not present

## 2021-07-19 DIAGNOSIS — J309 Allergic rhinitis, unspecified: Secondary | ICD-10-CM | POA: Diagnosis not present

## 2021-07-19 DIAGNOSIS — M810 Age-related osteoporosis without current pathological fracture: Secondary | ICD-10-CM | POA: Diagnosis not present

## 2021-07-19 DIAGNOSIS — N3281 Overactive bladder: Secondary | ICD-10-CM | POA: Diagnosis not present

## 2021-07-19 DIAGNOSIS — E559 Vitamin D deficiency, unspecified: Secondary | ICD-10-CM | POA: Diagnosis not present

## 2021-07-19 DIAGNOSIS — R600 Localized edema: Secondary | ICD-10-CM | POA: Diagnosis not present

## 2021-10-25 DIAGNOSIS — M810 Age-related osteoporosis without current pathological fracture: Secondary | ICD-10-CM | POA: Diagnosis not present

## 2021-10-25 DIAGNOSIS — G4700 Insomnia, unspecified: Secondary | ICD-10-CM | POA: Diagnosis not present

## 2021-10-25 DIAGNOSIS — E782 Mixed hyperlipidemia: Secondary | ICD-10-CM | POA: Diagnosis not present

## 2021-10-25 DIAGNOSIS — Z79899 Other long term (current) drug therapy: Secondary | ICD-10-CM | POA: Diagnosis not present

## 2021-10-25 DIAGNOSIS — K219 Gastro-esophageal reflux disease without esophagitis: Secondary | ICD-10-CM | POA: Diagnosis not present

## 2021-10-25 DIAGNOSIS — I10 Essential (primary) hypertension: Secondary | ICD-10-CM | POA: Diagnosis not present

## 2021-10-25 DIAGNOSIS — N3281 Overactive bladder: Secondary | ICD-10-CM | POA: Diagnosis not present

## 2021-10-25 DIAGNOSIS — Z6827 Body mass index (BMI) 27.0-27.9, adult: Secondary | ICD-10-CM | POA: Diagnosis not present

## 2021-10-25 DIAGNOSIS — R600 Localized edema: Secondary | ICD-10-CM | POA: Diagnosis not present

## 2021-10-25 DIAGNOSIS — E559 Vitamin D deficiency, unspecified: Secondary | ICD-10-CM | POA: Diagnosis not present

## 2021-10-25 DIAGNOSIS — R7303 Prediabetes: Secondary | ICD-10-CM | POA: Diagnosis not present

## 2022-01-31 DIAGNOSIS — Z139 Encounter for screening, unspecified: Secondary | ICD-10-CM | POA: Diagnosis not present

## 2022-01-31 DIAGNOSIS — Z9181 History of falling: Secondary | ICD-10-CM | POA: Diagnosis not present

## 2022-01-31 DIAGNOSIS — Z23 Encounter for immunization: Secondary | ICD-10-CM | POA: Diagnosis not present

## 2022-01-31 DIAGNOSIS — I10 Essential (primary) hypertension: Secondary | ICD-10-CM | POA: Diagnosis not present

## 2022-01-31 DIAGNOSIS — E782 Mixed hyperlipidemia: Secondary | ICD-10-CM | POA: Diagnosis not present

## 2022-01-31 DIAGNOSIS — E1169 Type 2 diabetes mellitus with other specified complication: Secondary | ICD-10-CM | POA: Diagnosis not present

## 2022-01-31 DIAGNOSIS — Z6826 Body mass index (BMI) 26.0-26.9, adult: Secondary | ICD-10-CM | POA: Diagnosis not present

## 2022-06-19 DIAGNOSIS — Z01818 Encounter for other preprocedural examination: Secondary | ICD-10-CM | POA: Diagnosis not present

## 2022-06-25 ENCOUNTER — Ambulatory Visit: Payer: Medicare PPO | Attending: Cardiology | Admitting: Cardiology

## 2022-06-25 ENCOUNTER — Encounter: Payer: Self-pay | Admitting: Cardiology

## 2022-06-25 VITALS — BP 156/54 | HR 58 | Ht 63.0 in | Wt 166.1 lb

## 2022-06-25 DIAGNOSIS — I10 Essential (primary) hypertension: Secondary | ICD-10-CM | POA: Diagnosis not present

## 2022-06-25 DIAGNOSIS — Z01810 Encounter for preprocedural cardiovascular examination: Secondary | ICD-10-CM | POA: Insufficient documentation

## 2022-06-25 DIAGNOSIS — R011 Cardiac murmur, unspecified: Secondary | ICD-10-CM

## 2022-06-25 DIAGNOSIS — E782 Mixed hyperlipidemia: Secondary | ICD-10-CM

## 2022-06-25 NOTE — Patient Instructions (Signed)
Medication Instructions:  Your physician recommends that you continue on your current medications as directed. Please refer to the Current Medication list given to you today.  *If you need a refill on your cardiac medications before your next appointment, please call your pharmacy*   Lab Work: None ordered If you have labs (blood work) drawn today and your tests are completely normal, you will receive your results only by: MyChart Message (if you have MyChart) OR A paper copy in the mail If you have any lab test that is abnormal or we need to change your treatment, we will call you to review the results.   Testing/Procedures: You are scheduled for a Myocardial Perfusion Imaging Study.  Please arrive 15 minutes prior to your appointment time for registration and insurance purposes.  The test will take approximately 3 to 4 hours to complete; you may bring reading material.  If someone comes with you to your appointment, they will need to remain in the main lobby due to limited space in the testing area.   How to prepare for your Myocardial Perfusion Test: Do not eat or drink 3 hours prior to your test, except you may have water. Do not consume products containing caffeine (regular or decaffeinated) 12 hours prior to your test. (ex: coffee, chocolate, sodas, tea). Do bring a list of your current medications with you.  If not listed below, you may take your medications as normal. Do wear comfortable clothes (no dresses or overalls) and walking shoes, tennis shoes preferred (No heels or open toe shoes are allowed). Do NOT wear cologne, perfume, aftershave, or lotions (deodorant is allowed). If these instructions are not followed, your test will have to be rescheduled.  If you cannot keep your appointment, please provide 24 hours notification to the [facility], to avoid a possible $50 charge to your account.   Your physician has requested that you have an echocardiogram. Echocardiography is  a painless test that uses sound waves to create images of your heart. It provides your doctor with information about the size and shape of your heart and how well your heart's chambers and valves are working. This procedure takes approximately one hour. There are no restrictions for this procedure. Please do NOT wear cologne, perfume, aftershave, or lotions (deodorant is allowed). Please arrive 15 minutes prior to your appointment time.   Follow-Up: At [facility], you and your health needs are our priority.  As part of our continuing mission to provide you with exceptional heart care, we have created designated Provider Care Teams.  These Care Teams include your primary Cardiologist (physician) and Advanced Practice Providers (APPs -  Physician Assistants and Nurse Practitioners) who all work together to provide you with the care you need, when you need it.  We recommend signing up for the patient portal called "MyChart".  Sign up information is provided on this After Visit Summary.  MyChart is used to connect with patients for Virtual Visits (Telemedicine).  Patients are able to view lab/test results, encounter notes, upcoming appointments, etc.  Non-urgent messages can be sent to your provider as well.   To learn more about what you can do with MyChart, go to [email or web address].    Your next appointment:   As needed  Provider:   Rajan Revankar, MD   Other Instructions  Cardiac Nuclear Scan A cardiac nuclear scan is a test that is done to check the flow of blood to your heart. It is done when you are resting and   when you are exercising. The test looks for problems such as: Not enough blood reaching a portion of the heart. The heart muscle not working as it should. You may need this test if you have: Heart disease. Lab results that are not normal. Had heart surgery or a balloon procedure to open up blocked arteries (angioplasty) or a small mesh tube (stent). Chest  pain. Shortness of breath. Had a heart attack. In this test, a special dye (tracer) is put into your bloodstream. The tracer will travel to your heart. A camera will then take pictures of your heart to see how the tracer moves through your heart. This test is usually done at a hospital and takes 2-4 hours. Tell a doctor about: Any allergies you have. All medicines you are taking, including vitamins, herbs, eye drops, creams, and over-the-counter medicines. Any bleeding problems you have. Any surgeries you have had. Any medical conditions you have. Whether you are pregnant or may be pregnant. Any history of asthma or long-term (chronic) lung disease. Any history of heart rhythm disorders or heart valve conditions. What are the risks? Your doctor will talk with you about risks. These may include: Serious chest pain and heart attack. This is only a risk if the stress portion of the test is done. Fast or uneven heartbeats (palpitations). A feeling of warmth in your chest. This feeling usually does not last long. Allergic reaction to the tracer. Shortness of breath or trouble breathing. What happens before the test? Ask your doctor about changing or stopping your normal medicines. Follow instructions from your doctor about what you cannot eat or drink. Remove your jewelry on the day of the test. Ask your doctor if you need to avoid nicotine or caffeine. What happens during the test? An IV tube will be inserted into one of your veins. Your doctor will give you a small amount of tracer through the IV tube. You will wait for 20-40 minutes while the tracer moves through your bloodstream. Your heart will be monitored with an electrocardiogram (ECG). You will lie down on an exam table. Pictures of your heart will be taken for about 15-20 minutes. You may also have a stress test. For this test, one of these things may be done: You will be asked to exercise on a treadmill or a stationary  bike. You will be given medicines that will make your heart work harder. This is done if you are unable to exercise. When blood flow to your heart has peaked, a tracer will again be given through the IV tube. After 20-40 minutes, you will get back on the exam table. More pictures will be taken of your heart. Depending on the tracer that is used, more pictures may need to be taken 3-4 hours later. Your IV tube will be removed when the test is over. The test may vary among doctors and hospitals. What happens after the test? Ask your doctor: Whether you can return to your normal schedule, including diet, activities, travel, and medicines. Whether you should drink more fluids. This will help to remove the tracer from your body. Ask your doctor, or the department that is doing the test: When will my results be ready? How will I get my results? What are my treatment options? What other tests do I need? What are my next steps? This information is not intended to replace advice given to you by your health care provider. Make sure you discuss any questions you have with your health care provider.   Document Revised: 07/03/2021 Document Reviewed: 07/03/2021 Elsevier Patient Education  2023 Elsevier Inc.  Echocardiogram An echocardiogram is a test that uses sound waves (ultrasound) to produce images of the heart. Images from an echocardiogram can provide important information about: Heart size and shape. The size and thickness and movement of your heart's walls. Heart muscle function and strength. Heart valve function or if you have stenosis. Stenosis is when the heart valves are too narrow. If blood is flowing backward through the heart valves (regurgitation). A tumor or infectious growth around the heart valves. Areas of heart muscle that are not working well because of poor blood flow or injury from a heart attack. Aneurysm detection. An aneurysm is a weak or damaged part of an artery wall. The  wall bulges out from the normal force of blood pumping through the body. Tell a health care provider about: Any allergies you have. All medicines you are taking, including vitamins, herbs, eye drops, creams, and over-the-counter medicines. Any blood disorders you have. Any surgeries you have had. Any medical conditions you have. Whether you are pregnant or may be pregnant. What are the risks? Generally, this is a safe test. However, problems may occur, including an allergic reaction to dye (contrast) that may be used during the test. What happens before the test? No specific preparation is needed. You may eat and drink normally. What happens during the test?  You will take off your clothes from the waist up and put on a hospital gown. Electrodes or electrocardiogram (ECG)patches may be placed on your chest. The electrodes or patches are then connected to a device that monitors your heart rate and rhythm. You will lie down on a table for an ultrasound exam. A gel will be applied to your chest to help sound waves pass through your skin. A handheld device, called a transducer, will be pressed against your chest and moved over your heart. The transducer produces sound waves that travel to your heart and bounce back (or "echo" back) to the transducer. These sound waves will be captured in real-time and changed into images of your heart that can be viewed on a video monitor. The images will be recorded on a computer and reviewed by your health care provider. You may be asked to change positions or hold your breath for a short time. This makes it easier to get different views or better views of your heart. In some cases, you may receive contrast through an IV in one of your veins. This can improve the quality of the pictures from your heart. The procedure may vary among health care providers and hospitals. What can I expect after the test? You may return to your normal, everyday life, including diet,  activities, and medicines, unless your health care provider tells you not to do that. Follow these instructions at home: It is up to you to get the results of your test. Ask your health care provider, or the department that is doing the test, when your results will be ready. Keep all follow-up visits. This is important. Summary An echocardiogram is a test that uses sound waves (ultrasound) to produce images of the heart. Images from an echocardiogram can provide important information about the size and shape of your heart, heart muscle function, heart valve function, and other possible heart problems. You do not need to do anything to prepare before this test. You may eat and drink normally. After the echocardiogram is completed, you may return to your normal, everyday life,   unless your health care provider tells you not to do that. This information is not intended to replace advice given to you by your health care provider. Make sure you discuss any questions you have with your health care provider. Document Revised: 10/18/2020 Document Reviewed: 09/28/2019 Elsevier Patient Education  2023 Elsevier Inc.    
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
## 2022-06-28 ENCOUNTER — Telehealth: Payer: Self-pay

## 2022-06-28 NOTE — Telephone Encounter (Signed)
   Pre-operative Risk Assessment    Patient Name: Virginia Pitts  DOB: 08/29/1945 MRN: [id number]      Request for Surgical Clearance    Procedure:   left total knee arthroplasty  Date of Surgery:  Clearance TBD                                 Surgeon:  Dr. Linda Hedges Surgeon's Group or Practice Name:  [facility] Phone number:  [phone] Fax number:  [phone]   Type of Clearance Requested:   - Medical    Type of Anesthesia:  General    Additional requests/questions:    SignedDione Housekeeper   06/28/2022, 1:57 PM

## 2022-06-28 NOTE — Telephone Encounter (Signed)
   Patient Name: Virginia Pitts  DOB: December 25, 1945 MRN: [id number]  Primary Cardiologist: None  Chart reviewed as part of pre-operative protocol coverage. Pre-op clearance already addressed by colleagues in earlier phone notes. To summarize recommendations:  -Already being address by MD. She will need Lexiscan. I will remove from pool.  Sharlene Dory, PA-C 06/28/2022, 2:14 PM

## 2022-07-11 ENCOUNTER — Telehealth (HOSPITAL_COMMUNITY): Payer: Self-pay | Admitting: *Deleted

## 2022-07-11 NOTE — Telephone Encounter (Signed)
Patient is returning call.  °

## 2022-07-11 NOTE — Telephone Encounter (Signed)
Left detailed instructions for MPI on 07/18/22.

## 2022-07-18 ENCOUNTER — Ambulatory Visit: Payer: Medicare PPO

## 2022-07-18 ENCOUNTER — Ambulatory Visit: Payer: Medicare PPO | Attending: Cardiology

## 2022-07-18 ENCOUNTER — Telehealth: Payer: Self-pay | Admitting: Cardiology

## 2022-07-18 DIAGNOSIS — Z01810 Encounter for preprocedural cardiovascular examination: Secondary | ICD-10-CM | POA: Diagnosis not present

## 2022-07-18 LAB — ECHOCARDIOGRAM COMPLETE
P 1/2 time: 756 msec
S' Lateral: 3.5 cm

## 2022-07-18 NOTE — Telephone Encounter (Signed)
Request for peer to peer has been requested.

## 2022-07-18 NOTE — Telephone Encounter (Signed)
Darleen from [facility] requesting a Peer to Peer with Dr Tomie China   Cpt code -09811 Maricela Curet - [id number]  Best number [phone]

## 2022-07-22 ENCOUNTER — Telehealth: Payer: Self-pay

## 2022-07-22 NOTE — Telephone Encounter (Signed)
-----   Message from Garwin Brothers, MD sent at 07/22/2022 10:32 AM EDT ----- The results of the study is unremarkable. Please inform patient. I will discuss in detail at next appointment. Cc  primary care/referring physician Garwin Brothers, MD 07/22/2022 10:32 AM

## 2022-07-22 NOTE — Telephone Encounter (Signed)
Left VM to callback.  Your echo showed Grade I diastolic dysfunction (impaired relaxation) this can come from normal aging and/or high blood pressure. Heart healthy diet, maintaining a normal BP and exercising can help this from getting worse.  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 You also have mild mitral valve regurgitation.   You have mild aortic valve regurgitation.

## 2022-08-13 DIAGNOSIS — E782 Mixed hyperlipidemia: Secondary | ICD-10-CM | POA: Diagnosis not present

## 2022-08-13 DIAGNOSIS — K219 Gastro-esophageal reflux disease without esophagitis: Secondary | ICD-10-CM | POA: Diagnosis not present

## 2022-08-13 DIAGNOSIS — I10 Essential (primary) hypertension: Secondary | ICD-10-CM | POA: Diagnosis not present

## 2022-08-13 DIAGNOSIS — N3281 Overactive bladder: Secondary | ICD-10-CM | POA: Diagnosis not present

## 2022-08-13 DIAGNOSIS — E1169 Type 2 diabetes mellitus with other specified complication: Secondary | ICD-10-CM | POA: Diagnosis not present

## 2022-08-13 DIAGNOSIS — R600 Localized edema: Secondary | ICD-10-CM | POA: Diagnosis not present

## 2022-08-13 DIAGNOSIS — G4700 Insomnia, unspecified: Secondary | ICD-10-CM | POA: Diagnosis not present

## 2022-08-13 DIAGNOSIS — M810 Age-related osteoporosis without current pathological fracture: Secondary | ICD-10-CM | POA: Diagnosis not present

## 2022-08-13 DIAGNOSIS — M79671 Pain in right foot: Secondary | ICD-10-CM | POA: Diagnosis not present

## 2022-08-14 LAB — EXTERNAL GENERIC LAB PROCEDURE

## 2022-08-28 DIAGNOSIS — Z1212 Encounter for screening for malignant neoplasm of rectum: Secondary | ICD-10-CM | POA: Diagnosis not present

## 2022-08-28 DIAGNOSIS — Z1211 Encounter for screening for malignant neoplasm of colon: Secondary | ICD-10-CM | POA: Diagnosis not present

## 2022-08-29 DIAGNOSIS — Z79899 Other long term (current) drug therapy: Secondary | ICD-10-CM | POA: Diagnosis not present

## 2022-08-29 DIAGNOSIS — M79609 Pain in unspecified limb: Secondary | ICD-10-CM | POA: Diagnosis not present

## 2022-08-29 DIAGNOSIS — R52 Pain, unspecified: Secondary | ICD-10-CM | POA: Diagnosis not present

## 2022-09-04 LAB — EXTERNAL GENERIC LAB PROCEDURE: COLOGUARD: NEGATIVE

## 2022-09-05 DIAGNOSIS — E7800 Pure hypercholesterolemia, unspecified: Secondary | ICD-10-CM | POA: Diagnosis not present

## 2022-09-05 DIAGNOSIS — Z96652 Presence of left artificial knee joint: Secondary | ICD-10-CM | POA: Diagnosis not present

## 2022-09-05 DIAGNOSIS — M1712 Unilateral primary osteoarthritis, left knee: Secondary | ICD-10-CM | POA: Diagnosis not present

## 2022-09-05 DIAGNOSIS — Z79899 Other long term (current) drug therapy: Secondary | ICD-10-CM | POA: Diagnosis not present

## 2022-09-05 DIAGNOSIS — I10 Essential (primary) hypertension: Secondary | ICD-10-CM | POA: Diagnosis not present

## 2022-09-05 DIAGNOSIS — Z471 Aftercare following joint replacement surgery: Secondary | ICD-10-CM | POA: Diagnosis not present

## 2022-09-05 DIAGNOSIS — R609 Edema, unspecified: Secondary | ICD-10-CM | POA: Diagnosis not present

## 2022-09-05 DIAGNOSIS — G8918 Other acute postprocedural pain: Secondary | ICD-10-CM | POA: Diagnosis not present

## 2022-09-05 DIAGNOSIS — Z7982 Long term (current) use of aspirin: Secondary | ICD-10-CM | POA: Diagnosis not present

## 2022-09-06 DIAGNOSIS — E7800 Pure hypercholesterolemia, unspecified: Secondary | ICD-10-CM | POA: Diagnosis not present

## 2022-09-06 DIAGNOSIS — Z7982 Long term (current) use of aspirin: Secondary | ICD-10-CM | POA: Diagnosis not present

## 2022-09-06 DIAGNOSIS — I10 Essential (primary) hypertension: Secondary | ICD-10-CM | POA: Diagnosis not present

## 2022-09-06 DIAGNOSIS — M1712 Unilateral primary osteoarthritis, left knee: Secondary | ICD-10-CM | POA: Diagnosis not present

## 2022-09-06 DIAGNOSIS — Z79899 Other long term (current) drug therapy: Secondary | ICD-10-CM | POA: Diagnosis not present

## 2022-09-07 DIAGNOSIS — G8929 Other chronic pain: Secondary | ICD-10-CM | POA: Diagnosis not present

## 2022-09-07 DIAGNOSIS — E663 Overweight: Secondary | ICD-10-CM | POA: Diagnosis not present

## 2022-09-07 DIAGNOSIS — I10 Essential (primary) hypertension: Secondary | ICD-10-CM | POA: Diagnosis not present

## 2022-09-07 DIAGNOSIS — Z7982 Long term (current) use of aspirin: Secondary | ICD-10-CM | POA: Diagnosis not present

## 2022-09-07 DIAGNOSIS — Z96653 Presence of artificial knee joint, bilateral: Secondary | ICD-10-CM | POA: Diagnosis not present

## 2022-09-07 DIAGNOSIS — Z471 Aftercare following joint replacement surgery: Secondary | ICD-10-CM | POA: Diagnosis not present

## 2022-09-07 DIAGNOSIS — E7800 Pure hypercholesterolemia, unspecified: Secondary | ICD-10-CM | POA: Diagnosis not present

## 2022-09-07 DIAGNOSIS — Z79899 Other long term (current) drug therapy: Secondary | ICD-10-CM | POA: Diagnosis not present

## 2022-09-07 DIAGNOSIS — N3281 Overactive bladder: Secondary | ICD-10-CM | POA: Diagnosis not present

## 2022-09-08 DIAGNOSIS — Z7982 Long term (current) use of aspirin: Secondary | ICD-10-CM | POA: Diagnosis not present

## 2022-09-08 DIAGNOSIS — E7800 Pure hypercholesterolemia, unspecified: Secondary | ICD-10-CM | POA: Diagnosis not present

## 2022-09-08 DIAGNOSIS — Z79899 Other long term (current) drug therapy: Secondary | ICD-10-CM | POA: Diagnosis not present

## 2022-09-08 DIAGNOSIS — Z96653 Presence of artificial knee joint, bilateral: Secondary | ICD-10-CM | POA: Diagnosis not present

## 2022-09-08 DIAGNOSIS — N3281 Overactive bladder: Secondary | ICD-10-CM | POA: Diagnosis not present

## 2022-09-08 DIAGNOSIS — E663 Overweight: Secondary | ICD-10-CM | POA: Diagnosis not present

## 2022-09-08 DIAGNOSIS — I10 Essential (primary) hypertension: Secondary | ICD-10-CM | POA: Diagnosis not present

## 2022-09-08 DIAGNOSIS — G8929 Other chronic pain: Secondary | ICD-10-CM | POA: Diagnosis not present

## 2022-09-08 DIAGNOSIS — Z471 Aftercare following joint replacement surgery: Secondary | ICD-10-CM | POA: Diagnosis not present

## 2022-09-09 DIAGNOSIS — I10 Essential (primary) hypertension: Secondary | ICD-10-CM | POA: Diagnosis not present

## 2022-09-09 DIAGNOSIS — N3281 Overactive bladder: Secondary | ICD-10-CM | POA: Diagnosis not present

## 2022-09-09 DIAGNOSIS — G8929 Other chronic pain: Secondary | ICD-10-CM | POA: Diagnosis not present

## 2022-09-09 DIAGNOSIS — E663 Overweight: Secondary | ICD-10-CM | POA: Diagnosis not present

## 2022-09-09 DIAGNOSIS — Z471 Aftercare following joint replacement surgery: Secondary | ICD-10-CM | POA: Diagnosis not present

## 2022-09-09 DIAGNOSIS — Z96653 Presence of artificial knee joint, bilateral: Secondary | ICD-10-CM | POA: Diagnosis not present

## 2022-09-09 DIAGNOSIS — Z79899 Other long term (current) drug therapy: Secondary | ICD-10-CM | POA: Diagnosis not present

## 2022-09-09 DIAGNOSIS — E7800 Pure hypercholesterolemia, unspecified: Secondary | ICD-10-CM | POA: Diagnosis not present

## 2022-09-09 DIAGNOSIS — Z7982 Long term (current) use of aspirin: Secondary | ICD-10-CM | POA: Diagnosis not present

## 2022-09-11 DIAGNOSIS — E7800 Pure hypercholesterolemia, unspecified: Secondary | ICD-10-CM | POA: Diagnosis not present

## 2022-09-11 DIAGNOSIS — N3281 Overactive bladder: Secondary | ICD-10-CM | POA: Diagnosis not present

## 2022-09-11 DIAGNOSIS — I10 Essential (primary) hypertension: Secondary | ICD-10-CM | POA: Diagnosis not present

## 2022-09-11 DIAGNOSIS — Z471 Aftercare following joint replacement surgery: Secondary | ICD-10-CM | POA: Diagnosis not present

## 2022-09-11 DIAGNOSIS — G8929 Other chronic pain: Secondary | ICD-10-CM | POA: Diagnosis not present

## 2022-09-11 DIAGNOSIS — Z96653 Presence of artificial knee joint, bilateral: Secondary | ICD-10-CM | POA: Diagnosis not present

## 2022-09-11 DIAGNOSIS — E663 Overweight: Secondary | ICD-10-CM | POA: Diagnosis not present

## 2022-09-11 DIAGNOSIS — Z79899 Other long term (current) drug therapy: Secondary | ICD-10-CM | POA: Diagnosis not present

## 2022-09-11 DIAGNOSIS — Z7982 Long term (current) use of aspirin: Secondary | ICD-10-CM | POA: Diagnosis not present

## 2022-09-13 DIAGNOSIS — N3281 Overactive bladder: Secondary | ICD-10-CM | POA: Diagnosis not present

## 2022-09-13 DIAGNOSIS — G8929 Other chronic pain: Secondary | ICD-10-CM | POA: Diagnosis not present

## 2022-09-13 DIAGNOSIS — Z96653 Presence of artificial knee joint, bilateral: Secondary | ICD-10-CM | POA: Diagnosis not present

## 2022-09-13 DIAGNOSIS — Z79899 Other long term (current) drug therapy: Secondary | ICD-10-CM | POA: Diagnosis not present

## 2022-09-13 DIAGNOSIS — E7800 Pure hypercholesterolemia, unspecified: Secondary | ICD-10-CM | POA: Diagnosis not present

## 2022-09-13 DIAGNOSIS — I10 Essential (primary) hypertension: Secondary | ICD-10-CM | POA: Diagnosis not present

## 2022-09-13 DIAGNOSIS — E663 Overweight: Secondary | ICD-10-CM | POA: Diagnosis not present

## 2022-09-13 DIAGNOSIS — Z7982 Long term (current) use of aspirin: Secondary | ICD-10-CM | POA: Diagnosis not present

## 2022-09-13 DIAGNOSIS — Z471 Aftercare following joint replacement surgery: Secondary | ICD-10-CM | POA: Diagnosis not present

## 2022-09-16 DIAGNOSIS — Z79899 Other long term (current) drug therapy: Secondary | ICD-10-CM | POA: Diagnosis not present

## 2022-09-16 DIAGNOSIS — E7800 Pure hypercholesterolemia, unspecified: Secondary | ICD-10-CM | POA: Diagnosis not present

## 2022-09-16 DIAGNOSIS — Z471 Aftercare following joint replacement surgery: Secondary | ICD-10-CM | POA: Diagnosis not present

## 2022-09-16 DIAGNOSIS — Z7982 Long term (current) use of aspirin: Secondary | ICD-10-CM | POA: Diagnosis not present

## 2022-09-16 DIAGNOSIS — Z96653 Presence of artificial knee joint, bilateral: Secondary | ICD-10-CM | POA: Diagnosis not present

## 2022-09-16 DIAGNOSIS — E663 Overweight: Secondary | ICD-10-CM | POA: Diagnosis not present

## 2022-09-16 DIAGNOSIS — N3281 Overactive bladder: Secondary | ICD-10-CM | POA: Diagnosis not present

## 2022-09-16 DIAGNOSIS — I10 Essential (primary) hypertension: Secondary | ICD-10-CM | POA: Diagnosis not present

## 2022-09-16 DIAGNOSIS — G8929 Other chronic pain: Secondary | ICD-10-CM | POA: Diagnosis not present

## 2022-09-18 DIAGNOSIS — N3281 Overactive bladder: Secondary | ICD-10-CM | POA: Diagnosis not present

## 2022-09-18 DIAGNOSIS — Z7982 Long term (current) use of aspirin: Secondary | ICD-10-CM | POA: Diagnosis not present

## 2022-09-18 DIAGNOSIS — I10 Essential (primary) hypertension: Secondary | ICD-10-CM | POA: Diagnosis not present

## 2022-09-18 DIAGNOSIS — E663 Overweight: Secondary | ICD-10-CM | POA: Diagnosis not present

## 2022-09-18 DIAGNOSIS — Z471 Aftercare following joint replacement surgery: Secondary | ICD-10-CM | POA: Diagnosis not present

## 2022-09-18 DIAGNOSIS — E7800 Pure hypercholesterolemia, unspecified: Secondary | ICD-10-CM | POA: Diagnosis not present

## 2022-09-18 DIAGNOSIS — Z79899 Other long term (current) drug therapy: Secondary | ICD-10-CM | POA: Diagnosis not present

## 2022-09-18 DIAGNOSIS — Z96653 Presence of artificial knee joint, bilateral: Secondary | ICD-10-CM | POA: Diagnosis not present

## 2022-09-18 DIAGNOSIS — G8929 Other chronic pain: Secondary | ICD-10-CM | POA: Diagnosis not present

## 2022-09-19 DIAGNOSIS — M25462 Effusion, left knee: Secondary | ICD-10-CM | POA: Diagnosis not present

## 2022-09-19 DIAGNOSIS — M25662 Stiffness of left knee, not elsewhere classified: Secondary | ICD-10-CM | POA: Diagnosis not present

## 2022-09-19 DIAGNOSIS — M25562 Pain in left knee: Secondary | ICD-10-CM | POA: Diagnosis not present

## 2022-09-23 DIAGNOSIS — M25462 Effusion, left knee: Secondary | ICD-10-CM | POA: Diagnosis not present

## 2022-09-23 DIAGNOSIS — M25662 Stiffness of left knee, not elsewhere classified: Secondary | ICD-10-CM | POA: Diagnosis not present

## 2022-09-23 DIAGNOSIS — M25562 Pain in left knee: Secondary | ICD-10-CM | POA: Diagnosis not present

## 2022-09-26 DIAGNOSIS — M25662 Stiffness of left knee, not elsewhere classified: Secondary | ICD-10-CM | POA: Diagnosis not present

## 2022-09-26 DIAGNOSIS — M25462 Effusion, left knee: Secondary | ICD-10-CM | POA: Diagnosis not present

## 2022-09-26 DIAGNOSIS — M25562 Pain in left knee: Secondary | ICD-10-CM | POA: Diagnosis not present

## 2022-09-30 DIAGNOSIS — M25462 Effusion, left knee: Secondary | ICD-10-CM | POA: Diagnosis not present

## 2022-09-30 DIAGNOSIS — M25662 Stiffness of left knee, not elsewhere classified: Secondary | ICD-10-CM | POA: Diagnosis not present

## 2022-09-30 DIAGNOSIS — M25562 Pain in left knee: Secondary | ICD-10-CM | POA: Diagnosis not present

## 2022-10-03 DIAGNOSIS — M25562 Pain in left knee: Secondary | ICD-10-CM | POA: Diagnosis not present

## 2022-10-03 DIAGNOSIS — M25462 Effusion, left knee: Secondary | ICD-10-CM | POA: Diagnosis not present

## 2022-10-03 DIAGNOSIS — M25662 Stiffness of left knee, not elsewhere classified: Secondary | ICD-10-CM | POA: Diagnosis not present

## 2022-10-07 DIAGNOSIS — M25462 Effusion, left knee: Secondary | ICD-10-CM | POA: Diagnosis not present

## 2022-10-07 DIAGNOSIS — M25562 Pain in left knee: Secondary | ICD-10-CM | POA: Diagnosis not present

## 2022-10-07 DIAGNOSIS — M25662 Stiffness of left knee, not elsewhere classified: Secondary | ICD-10-CM | POA: Diagnosis not present

## 2022-10-10 DIAGNOSIS — M25462 Effusion, left knee: Secondary | ICD-10-CM | POA: Diagnosis not present

## 2022-10-10 DIAGNOSIS — M25562 Pain in left knee: Secondary | ICD-10-CM | POA: Diagnosis not present

## 2022-10-10 DIAGNOSIS — M25662 Stiffness of left knee, not elsewhere classified: Secondary | ICD-10-CM | POA: Diagnosis not present

## 2022-10-14 DIAGNOSIS — M25662 Stiffness of left knee, not elsewhere classified: Secondary | ICD-10-CM | POA: Diagnosis not present

## 2022-10-14 DIAGNOSIS — M25462 Effusion, left knee: Secondary | ICD-10-CM | POA: Diagnosis not present

## 2022-10-14 DIAGNOSIS — M25562 Pain in left knee: Secondary | ICD-10-CM | POA: Diagnosis not present

## 2022-10-16 DIAGNOSIS — M1712 Unilateral primary osteoarthritis, left knee: Secondary | ICD-10-CM | POA: Diagnosis not present

## 2022-10-17 DIAGNOSIS — M25662 Stiffness of left knee, not elsewhere classified: Secondary | ICD-10-CM | POA: Diagnosis not present

## 2022-10-17 DIAGNOSIS — M25462 Effusion, left knee: Secondary | ICD-10-CM | POA: Diagnosis not present

## 2022-10-17 DIAGNOSIS — M25562 Pain in left knee: Secondary | ICD-10-CM | POA: Diagnosis not present

## 2022-10-17 DIAGNOSIS — Z9181 History of falling: Secondary | ICD-10-CM | POA: Diagnosis not present

## 2022-10-17 DIAGNOSIS — Z0000 Encounter for general adult medical examination without abnormal findings: Secondary | ICD-10-CM | POA: Diagnosis not present

## 2022-10-22 ENCOUNTER — Telehealth: Payer: Self-pay

## 2022-10-22 DIAGNOSIS — M25562 Pain in left knee: Secondary | ICD-10-CM | POA: Diagnosis not present

## 2022-10-22 DIAGNOSIS — M25462 Effusion, left knee: Secondary | ICD-10-CM | POA: Diagnosis not present

## 2022-10-22 DIAGNOSIS — M25662 Stiffness of left knee, not elsewhere classified: Secondary | ICD-10-CM | POA: Diagnosis not present

## 2022-10-22 NOTE — Telephone Encounter (Signed)
Spoke to [facility] who states that the pt has been denied and you cannot schedule a peer to peer. Please advise.

## 2022-10-24 DIAGNOSIS — M25662 Stiffness of left knee, not elsewhere classified: Secondary | ICD-10-CM | POA: Diagnosis not present

## 2022-10-24 DIAGNOSIS — M25562 Pain in left knee: Secondary | ICD-10-CM | POA: Diagnosis not present

## 2022-10-24 DIAGNOSIS — M25462 Effusion, left knee: Secondary | ICD-10-CM | POA: Diagnosis not present

## 2022-11-28 DIAGNOSIS — N3281 Overactive bladder: Secondary | ICD-10-CM | POA: Diagnosis not present

## 2022-11-28 DIAGNOSIS — Z23 Encounter for immunization: Secondary | ICD-10-CM | POA: Diagnosis not present

## 2022-11-28 DIAGNOSIS — J309 Allergic rhinitis, unspecified: Secondary | ICD-10-CM | POA: Diagnosis not present

## 2022-11-28 DIAGNOSIS — Z79899 Other long term (current) drug therapy: Secondary | ICD-10-CM | POA: Diagnosis not present

## 2022-11-28 DIAGNOSIS — E559 Vitamin D deficiency, unspecified: Secondary | ICD-10-CM | POA: Diagnosis not present

## 2022-11-28 DIAGNOSIS — I10 Essential (primary) hypertension: Secondary | ICD-10-CM | POA: Diagnosis not present

## 2022-11-28 DIAGNOSIS — E782 Mixed hyperlipidemia: Secondary | ICD-10-CM | POA: Diagnosis not present

## 2022-11-28 DIAGNOSIS — E1169 Type 2 diabetes mellitus with other specified complication: Secondary | ICD-10-CM | POA: Diagnosis not present

## 2022-11-28 DIAGNOSIS — K219 Gastro-esophageal reflux disease without esophagitis: Secondary | ICD-10-CM | POA: Diagnosis not present

## 2022-11-28 DIAGNOSIS — G4700 Insomnia, unspecified: Secondary | ICD-10-CM | POA: Diagnosis not present

## 2023-02-27 DIAGNOSIS — G4700 Insomnia, unspecified: Secondary | ICD-10-CM | POA: Diagnosis not present

## 2023-02-27 DIAGNOSIS — N3281 Overactive bladder: Secondary | ICD-10-CM | POA: Diagnosis not present

## 2023-02-27 DIAGNOSIS — K219 Gastro-esophageal reflux disease without esophagitis: Secondary | ICD-10-CM | POA: Diagnosis not present

## 2023-02-27 DIAGNOSIS — E1169 Type 2 diabetes mellitus with other specified complication: Secondary | ICD-10-CM | POA: Diagnosis not present

## 2023-02-27 DIAGNOSIS — I10 Essential (primary) hypertension: Secondary | ICD-10-CM | POA: Diagnosis not present

## 2023-02-27 DIAGNOSIS — E559 Vitamin D deficiency, unspecified: Secondary | ICD-10-CM | POA: Diagnosis not present

## 2023-02-27 DIAGNOSIS — E782 Mixed hyperlipidemia: Secondary | ICD-10-CM | POA: Diagnosis not present

## 2023-02-27 DIAGNOSIS — J309 Allergic rhinitis, unspecified: Secondary | ICD-10-CM | POA: Diagnosis not present

## 2023-02-27 DIAGNOSIS — M810 Age-related osteoporosis without current pathological fracture: Secondary | ICD-10-CM | POA: Diagnosis not present

## 2023-03-10 DIAGNOSIS — M1712 Unilateral primary osteoarthritis, left knee: Secondary | ICD-10-CM | POA: Diagnosis not present

## 2023-05-21 DIAGNOSIS — H52223 Regular astigmatism, bilateral: Secondary | ICD-10-CM | POA: Diagnosis not present

## 2023-05-21 DIAGNOSIS — E113211 Type 2 diabetes mellitus with mild nonproliferative diabetic retinopathy with macular edema, right eye: Secondary | ICD-10-CM | POA: Diagnosis not present

## 2023-05-21 DIAGNOSIS — H35021 Exudative retinopathy, right eye: Secondary | ICD-10-CM | POA: Diagnosis not present

## 2023-05-21 DIAGNOSIS — E11311 Type 2 diabetes mellitus with unspecified diabetic retinopathy with macular edema: Secondary | ICD-10-CM | POA: Diagnosis not present

## 2023-05-21 DIAGNOSIS — Z7984 Long term (current) use of oral hypoglycemic drugs: Secondary | ICD-10-CM | POA: Diagnosis not present

## 2023-05-21 DIAGNOSIS — E119 Type 2 diabetes mellitus without complications: Secondary | ICD-10-CM | POA: Diagnosis not present

## 2023-05-21 DIAGNOSIS — Z961 Presence of intraocular lens: Secondary | ICD-10-CM | POA: Diagnosis not present

## 2023-05-21 DIAGNOSIS — Z9849 Cataract extraction status, unspecified eye: Secondary | ICD-10-CM | POA: Diagnosis not present

## 2023-05-21 DIAGNOSIS — Z0100 Encounter for examination of eyes and vision without abnormal findings: Secondary | ICD-10-CM | POA: Diagnosis not present

## 2023-05-29 DIAGNOSIS — E782 Mixed hyperlipidemia: Secondary | ICD-10-CM | POA: Diagnosis not present

## 2023-05-29 DIAGNOSIS — I10 Essential (primary) hypertension: Secondary | ICD-10-CM | POA: Diagnosis not present

## 2023-05-29 DIAGNOSIS — Z79899 Other long term (current) drug therapy: Secondary | ICD-10-CM | POA: Diagnosis not present

## 2023-05-29 DIAGNOSIS — E1169 Type 2 diabetes mellitus with other specified complication: Secondary | ICD-10-CM | POA: Diagnosis not present

## 2023-06-26 DIAGNOSIS — K219 Gastro-esophageal reflux disease without esophagitis: Secondary | ICD-10-CM | POA: Diagnosis not present

## 2023-06-26 DIAGNOSIS — R1310 Dysphagia, unspecified: Secondary | ICD-10-CM | POA: Diagnosis not present

## 2023-07-08 DIAGNOSIS — E1169 Type 2 diabetes mellitus with other specified complication: Secondary | ICD-10-CM | POA: Diagnosis not present

## 2023-07-08 DIAGNOSIS — E559 Vitamin D deficiency, unspecified: Secondary | ICD-10-CM | POA: Diagnosis not present

## 2023-07-08 DIAGNOSIS — N3281 Overactive bladder: Secondary | ICD-10-CM | POA: Diagnosis not present

## 2023-07-08 DIAGNOSIS — G4700 Insomnia, unspecified: Secondary | ICD-10-CM | POA: Diagnosis not present

## 2023-07-08 DIAGNOSIS — I10 Essential (primary) hypertension: Secondary | ICD-10-CM | POA: Diagnosis not present

## 2023-07-08 DIAGNOSIS — K219 Gastro-esophageal reflux disease without esophagitis: Secondary | ICD-10-CM | POA: Diagnosis not present

## 2023-07-08 DIAGNOSIS — E782 Mixed hyperlipidemia: Secondary | ICD-10-CM | POA: Diagnosis not present

## 2023-07-08 DIAGNOSIS — J309 Allergic rhinitis, unspecified: Secondary | ICD-10-CM | POA: Diagnosis not present

## 2023-07-08 DIAGNOSIS — Z6826 Body mass index (BMI) 26.0-26.9, adult: Secondary | ICD-10-CM | POA: Diagnosis not present

## 2023-07-10 DIAGNOSIS — E1169 Type 2 diabetes mellitus with other specified complication: Secondary | ICD-10-CM | POA: Diagnosis not present

## 2023-08-13 DIAGNOSIS — Z882 Allergy status to sulfonamides status: Secondary | ICD-10-CM | POA: Diagnosis not present

## 2023-08-13 DIAGNOSIS — K208 Other esophagitis without bleeding: Secondary | ICD-10-CM | POA: Diagnosis not present

## 2023-08-13 DIAGNOSIS — Z888 Allergy status to other drugs, medicaments and biological substances status: Secondary | ICD-10-CM | POA: Diagnosis not present

## 2023-08-13 DIAGNOSIS — K449 Diaphragmatic hernia without obstruction or gangrene: Secondary | ICD-10-CM | POA: Diagnosis not present

## 2023-08-13 DIAGNOSIS — I10 Essential (primary) hypertension: Secondary | ICD-10-CM | POA: Diagnosis not present

## 2023-08-13 DIAGNOSIS — K222 Esophageal obstruction: Secondary | ICD-10-CM | POA: Diagnosis not present

## 2023-08-13 DIAGNOSIS — E119 Type 2 diabetes mellitus without complications: Secondary | ICD-10-CM | POA: Diagnosis not present

## 2023-08-13 DIAGNOSIS — K2970 Gastritis, unspecified, without bleeding: Secondary | ICD-10-CM | POA: Diagnosis not present

## 2023-08-13 DIAGNOSIS — K2971 Gastritis, unspecified, with bleeding: Secondary | ICD-10-CM | POA: Diagnosis not present

## 2023-08-13 DIAGNOSIS — R1310 Dysphagia, unspecified: Secondary | ICD-10-CM | POA: Diagnosis not present

## 2023-09-16 DIAGNOSIS — M1712 Unilateral primary osteoarthritis, left knee: Secondary | ICD-10-CM | POA: Diagnosis not present

## 2023-10-07 DIAGNOSIS — E1169 Type 2 diabetes mellitus with other specified complication: Secondary | ICD-10-CM | POA: Diagnosis not present

## 2023-10-07 DIAGNOSIS — N3281 Overactive bladder: Secondary | ICD-10-CM | POA: Diagnosis not present

## 2023-10-07 DIAGNOSIS — K219 Gastro-esophageal reflux disease without esophagitis: Secondary | ICD-10-CM | POA: Diagnosis not present

## 2023-10-07 DIAGNOSIS — E782 Mixed hyperlipidemia: Secondary | ICD-10-CM | POA: Diagnosis not present

## 2023-10-07 DIAGNOSIS — I10 Essential (primary) hypertension: Secondary | ICD-10-CM | POA: Diagnosis not present

## 2023-10-07 DIAGNOSIS — E559 Vitamin D deficiency, unspecified: Secondary | ICD-10-CM | POA: Diagnosis not present

## 2023-10-07 DIAGNOSIS — J309 Allergic rhinitis, unspecified: Secondary | ICD-10-CM | POA: Diagnosis not present

## 2023-10-07 DIAGNOSIS — G4700 Insomnia, unspecified: Secondary | ICD-10-CM | POA: Diagnosis not present

## 2023-10-07 DIAGNOSIS — M810 Age-related osteoporosis without current pathological fracture: Secondary | ICD-10-CM | POA: Diagnosis not present

## 2023-10-07 DIAGNOSIS — Z79899 Other long term (current) drug therapy: Secondary | ICD-10-CM | POA: Diagnosis not present

## 2023-10-29 DIAGNOSIS — Z0000 Encounter for general adult medical examination without abnormal findings: Secondary | ICD-10-CM | POA: Diagnosis not present

## 2023-10-29 DIAGNOSIS — Z9181 History of falling: Secondary | ICD-10-CM | POA: Diagnosis not present

## 2023-11-13 DIAGNOSIS — R1310 Dysphagia, unspecified: Secondary | ICD-10-CM | POA: Diagnosis not present

## 2023-12-29 DIAGNOSIS — E119 Type 2 diabetes mellitus without complications: Secondary | ICD-10-CM | POA: Diagnosis not present

## 2023-12-29 DIAGNOSIS — Z7984 Long term (current) use of oral hypoglycemic drugs: Secondary | ICD-10-CM | POA: Diagnosis not present
